# Patient Record
Sex: Female | Born: 1945 | Race: White | Hispanic: No | Smoking: Never smoker
Health system: Southern US, Community
[De-identification: ages and names within clinical notes are randomized; demographics above are authoritative.]

## PROBLEM LIST (undated history)

## (undated) DIAGNOSIS — E079 Disorder of thyroid, unspecified: Secondary | ICD-10-CM

## (undated) DIAGNOSIS — E785 Hyperlipidemia, unspecified: Secondary | ICD-10-CM

## (undated) DIAGNOSIS — I10 Essential (primary) hypertension: Secondary | ICD-10-CM

## (undated) HISTORY — DX: Hyperlipidemia, unspecified: E78.5

## (undated) HISTORY — PX: EYE SURGERY: SHX253

## (undated) HISTORY — DX: Essential (primary) hypertension: I10

## (undated) HISTORY — PX: OTHER SURGICAL HISTORY: SHX169

## (undated) HISTORY — DX: Disorder of thyroid, unspecified: E07.9

## (undated) HISTORY — PX: COSMETIC SURGERY: SHX468

## (undated) HISTORY — PX: BREAST SURGERY: SHX581

## (undated) HISTORY — PX: RECTOPERITONEAL FISTULA CLOSURE: SHX2314

## (undated) HISTORY — PX: TONSILLECTOMY: SUR1361

---

## 2003-10-20 ENCOUNTER — Ambulatory Visit (HOSPITAL_COMMUNITY): Admission: RE | Admit: 2003-10-20 | Discharge: 2003-10-20 | Payer: Self-pay | Admitting: *Deleted

## 2011-12-18 ENCOUNTER — Ambulatory Visit (INDEPENDENT_AMBULATORY_CARE_PROVIDER_SITE_OTHER): Payer: 59 | Admitting: Internal Medicine

## 2011-12-18 DIAGNOSIS — G5710 Meralgia paresthetica, unspecified lower limb: Secondary | ICD-10-CM

## 2011-12-18 DIAGNOSIS — M79609 Pain in unspecified limb: Secondary | ICD-10-CM

## 2011-12-18 DIAGNOSIS — B079 Viral wart, unspecified: Secondary | ICD-10-CM

## 2015-01-23 DIAGNOSIS — H2513 Age-related nuclear cataract, bilateral: Secondary | ICD-10-CM | POA: Diagnosis not present

## 2015-03-21 DIAGNOSIS — H18412 Arcus senilis, left eye: Secondary | ICD-10-CM | POA: Diagnosis not present

## 2015-03-21 DIAGNOSIS — H02839 Dermatochalasis of unspecified eye, unspecified eyelid: Secondary | ICD-10-CM | POA: Diagnosis not present

## 2015-03-21 DIAGNOSIS — H2511 Age-related nuclear cataract, right eye: Secondary | ICD-10-CM | POA: Diagnosis not present

## 2015-03-21 DIAGNOSIS — H18411 Arcus senilis, right eye: Secondary | ICD-10-CM | POA: Diagnosis not present

## 2015-04-03 DIAGNOSIS — H25811 Combined forms of age-related cataract, right eye: Secondary | ICD-10-CM | POA: Diagnosis not present

## 2015-04-03 DIAGNOSIS — H2511 Age-related nuclear cataract, right eye: Secondary | ICD-10-CM | POA: Diagnosis not present

## 2015-04-04 DIAGNOSIS — H2512 Age-related nuclear cataract, left eye: Secondary | ICD-10-CM | POA: Diagnosis not present

## 2015-04-21 DIAGNOSIS — H25812 Combined forms of age-related cataract, left eye: Secondary | ICD-10-CM | POA: Diagnosis not present

## 2015-04-21 DIAGNOSIS — H2512 Age-related nuclear cataract, left eye: Secondary | ICD-10-CM | POA: Diagnosis not present

## 2015-10-24 ENCOUNTER — Ambulatory Visit (INDEPENDENT_AMBULATORY_CARE_PROVIDER_SITE_OTHER): Payer: Medicare Other

## 2015-10-24 ENCOUNTER — Ambulatory Visit (INDEPENDENT_AMBULATORY_CARE_PROVIDER_SITE_OTHER): Payer: Medicare Other | Admitting: Family Medicine

## 2015-10-24 VITALS — BP 130/85 | HR 90 | Temp 97.7°F | Resp 16 | Ht 65.0 in | Wt 140.6 lb

## 2015-10-24 DIAGNOSIS — Z8639 Personal history of other endocrine, nutritional and metabolic disease: Secondary | ICD-10-CM

## 2015-10-24 DIAGNOSIS — M25511 Pain in right shoulder: Secondary | ICD-10-CM

## 2015-10-24 DIAGNOSIS — Z23 Encounter for immunization: Secondary | ICD-10-CM

## 2015-10-24 DIAGNOSIS — M75101 Unspecified rotator cuff tear or rupture of right shoulder, not specified as traumatic: Secondary | ICD-10-CM

## 2015-10-24 DIAGNOSIS — R252 Cramp and spasm: Secondary | ICD-10-CM

## 2015-10-24 DIAGNOSIS — M7541 Impingement syndrome of right shoulder: Secondary | ICD-10-CM

## 2015-10-24 DIAGNOSIS — M25519 Pain in unspecified shoulder: Secondary | ICD-10-CM | POA: Diagnosis not present

## 2015-10-24 LAB — TSH: TSH: 3.779 u[IU]/mL (ref 0.350–4.500)

## 2015-10-24 LAB — BASIC METABOLIC PANEL WITH GFR
Chloride: 103 mmol/L (ref 98–110)
Potassium: 4.4 mmol/L (ref 3.5–5.3)
Sodium: 137 mmol/L (ref 135–146)

## 2015-10-24 LAB — BASIC METABOLIC PANEL
BUN: 16 mg/dL (ref 7–25)
CO2: 25 mmol/L (ref 20–31)
Calcium: 9.5 mg/dL (ref 8.6–10.4)
Creat: 0.73 mg/dL (ref 0.50–0.99)
Glucose, Bld: 90 mg/dL (ref 65–99)

## 2015-10-24 MED ORDER — NAPROXEN 500 MG PO TABS: 500.0000 mg | ORAL_TABLET | Freq: Two times a day (BID) | ORAL | Status: DC

## 2015-10-24 MED ORDER — CYCLOBENZAPRINE HCL 5 MG PO TABS: 5.0000 mg | ORAL_TABLET | Freq: Every evening | ORAL | Status: DC | PRN

## 2015-10-24 NOTE — Patient Instructions (Signed)

## 2015-10-24 NOTE — Progress Notes (Signed)
 Chief Complaint:  Chief Complaint  Patient presents with  . Arm Pain    right x 4 weeks     HPI: Erin Gay is a 69 y.o. female who reports to [facility] today complaining of:   1. right arm pain on the lateral side of her right deltoid area, upper arm/lowere shoulder, described as mild to moderate aching pain, for the last 4 weeks. NKI. She has no numbness or tingling, She has had some radiation to the neck and and shoulder. No prior injuries. She thinks she had osteopenia 5 years ago. She is right handed and bowls without problems.  She has some pain with certain movements such as when she brings her arm out and over, back. She is not sure if she has night pain.   2. Muscle cramps any time, she has ahd msk cramps for several years, her toes and fingers would  Draw under. This can hurt a lot. She has it mostly with lifting or moving something a certain way, She never has a problems getting up. She has had a hx of trigger ginger and has had injections in her hands before. This has been a chronic isses  3. She has a history of thyroid nodule removal with thyroid fully retained. No one has followed her thyroid function since she had this benign nodule removed  4. Considering flu vaccine, husband is immunocompromised.   Past Medical History  Diagnosis Date  . Thyroid disease    Past Surgical History  Procedure Laterality Date  . Breast surgery    . Cosmetic surgery    . Eye surgery     Social History   Social History  . Marital Status: Single    Spouse Name: N/A  . Number of Children: N/A  . Years of Education: N/A   Social History Main Topics  . Smoking status: Never Smoker   . Smokeless tobacco: None  . Alcohol Use: None  . Drug Use: None  . Sexual Activity: Not Asked   Other Topics Concern  . None   Social History Narrative  . None   Family History  Problem Relation Age of Onset  . Cancer Mother   . Heart disease Mother   . Hyperlipidemia Mother   .  Heart disease Father   . Heart disease Brother   . Diabetes Brother    No Known Allergies Prior to Admission medications   Not on File     ROS: The patient denies fevers, chills, night sweats, unintentional weight loss, chest pain, palpitations, wheezing, dyspnea on exertion, nausea, vomiting, abdominal pain, dysuria, hematuria, melena  All other systems have been reviewed and were otherwise negative with the exception of those mentioned in the HPI and as above.    PHYSICAL EXAM: Filed Vitals:   10/24/15 0816  BP: 130/85  Pulse: 90  Temp: 97.7 F (36.5 C)  Resp: 16   Body mass index is 23.4 kg/(m^2).   General: Alert, no acute distress HEENT:  Normocephalic, atraumatic, oropharynx patent. EOMI, PERRLA, no appreciable thyroid goiter Cardiovascular:  Regular rate and rhythm, no rubs murmurs or gallops.  No Carotid bruits, radial pulse intact. No pedal edema.  Respiratory: Clear to auscultation bilaterally.  No wheezes, rales, or rhonchi.  No cyanosis, no use of accessory musculature Abdominal: No organomegaly, abdomen is soft and non-tender, positive bowel sounds. No masses. Skin: No rashes. Neurologic: Facial musculature symmetric. Psychiatric: Patient acts appropriately throughout our interaction. Lymphatic: No cervical or  submandibular lymphadenopathy Musculoskeletal: Gait intact. No edema, tenderness Neck exam normal-neg spurling Shoulder No deformity, no hypertrophy/atrophy, no erythema, no fluid, no wounds Full ROM Nontender at [facility] jt Neg Empty Can test, neg Lift off test, neg Speeds, Neg Hawkins/Neers 5/5 strength, 2/2 triceps and biceps DTRs  LABS: No results found for this or any previous visit.   EKG/XRAY:   Primary read interpreted by Dr. Marin Comment at [facility]. + DJD , no acute fracture or dislocation   ASSESSMENT/PLAN: Encounter Diagnoses  Name Primary?  . Right shoulder pain   . History of thyroid nodule   . Muscle cramping   . Rotator cuff impingement  syndrome of right shoulder Yes  . Flu vaccine need    Rotator cuff exercise given Flu vaccine given Rx Flexeril, Naproxen prn Biofreeze oitnment prn  Fu prn , if no improvement in 3 weeks,may consider steroid ( she wants to see Dr Rip Harbour)   Gross sideeffects, risk and benefits, and alternatives of medications d/w patient. Patient is aware that all medications have potential sideeffects and we are unable to predict every sideeffect or drug-drug interaction that may occur.    DO  10/24/2015 9:30 AM

## 2016-01-28 ENCOUNTER — Ambulatory Visit (INDEPENDENT_AMBULATORY_CARE_PROVIDER_SITE_OTHER): Payer: Medicare Other | Admitting: Physician Assistant

## 2016-01-28 VITALS — BP 158/98 | HR 69 | Temp 98.4°F | Resp 20 | Ht 65.0 in | Wt 142.6 lb

## 2016-01-28 DIAGNOSIS — M545 Low back pain, unspecified: Secondary | ICD-10-CM

## 2016-01-28 LAB — POCT URINALYSIS DIP (MANUAL ENTRY)
BILIRUBIN UA: NEGATIVE
BILIRUBIN UA: NEGATIVE
Glucose, UA: NEGATIVE
LEUKOCYTES UA: NEGATIVE
Nitrite, UA: NEGATIVE
Protein Ur, POC: NEGATIVE
RBC UA: NEGATIVE
Spec Grav, UA: 1.015
Urobilinogen, UA: 0.2
pH, UA: 5.5

## 2016-01-28 NOTE — Progress Notes (Signed)
01/28/2016 10:44 AM   DOB: 13-Sep-1946 / MRN: [id number]  SUBJECTIVE:  Erin Gay is a 70 y.o. never smnoking female presenting for the evaluation of gradually worsening "achy" left sided back pain that started 7 days ago. Associated symptoms include no other symptoms, and she denies weakness, numbness, tingling, dysuria, leg pain.Treatments tried thus far include Tylenol with some relief.. She denies fever, nausea, dysuria, frequency and urgency. No previous imaging of her back exist in [facility].   She has No Known Allergies.   She  has a past medical history of Thyroid disease.    She  reports that she has never smoked. She does not have any smokeless tobacco history on file. She  has no sexual activity history on file. The patient  has past surgical history that includes Breast surgery; Cosmetic surgery; and Eye surgery.  Her family history includes Cancer in her mother; Diabetes in her brother; Heart disease in her brother, father, and mother; Hyperlipidemia in her mother.  Review of Systems  Constitutional: Negative for fever and chills.  Eyes: Negative for blurred vision.  Respiratory: Negative for cough and shortness of breath.   Cardiovascular: Negative for chest pain.  Gastrointestinal: Negative for nausea and abdominal pain.  Genitourinary: Negative for dysuria, urgency and frequency.  Musculoskeletal: Positive for back pain. Negative for myalgias.  Skin: Negative for rash.  Neurological: Negative for dizziness, tingling and headaches.  Psychiatric/Behavioral: Negative for depression. The patient is not nervous/anxious.     Problem list and medications reviewed and updated by myself where necessary, and exist elsewhere in the encounter.   OBJECTIVE:  BP 158/98 mmHg  Pulse 69  Temp(Src) 98.4 F (36.9 C) (Oral)  Resp 20  Ht 5\' 5"  (1.651 m)  Wt 142 lb 9.6 oz (64.683 kg)  BMI 23.73 kg/m2  SpO2 98% CrCl cannot be calculated (Patient has no serum creatinine result on  file.).  Physical Exam  Constitutional: She is oriented to person, place, and time. She appears well-nourished. No distress.  Eyes: EOM are normal. Pupils are equal, round, and reactive to light.  Cardiovascular: Normal rate.   Pulmonary/Chest: Effort normal.  Abdominal: She exhibits no distension.  Neurological: She is alert and oriented to person, place, and time. No cranial nerve deficit. Gait normal.  Reflex Scores:      Patellar reflexes are 2+ on the right side and 2+ on the left side.      Achilles reflexes are 2+ on the right side and 2+ on the left side. Lower extremity strength 5/5.  Sensation intact and equal to light touch bilaterally.    Skin: Skin is dry. She is not diaphoretic.     Psychiatric: She has a normal mood and affect.  Vitals reviewed.   No results found for this or any previous visit (from the past 48 hour(s)).  No results found.  ASSESSMENT AND PLAN  Erin Gay was seen today for back pain.  Diagnoses and all orders for this visit:  Left-sided low back pain without sciatica: Her symptoms are consistent with an MSK etiology. Will screen for UTI however doubt this as she has no symptoms.  She has prescription strength Naprosyn and Flexeril at home from a previous appointment and I have advised that she take these for two weeks without fail.  RTC as needed for this problem.  -     POCT urinalysis dipstick   The patient was advised to call or return to clinic if she does not see an improvement  in symptoms or to seek the care of the closest emergency department if she worsens with the above plan.   Philis Fendt, MHS, PA-C [facility] 01/28/2016 10:44 AM

## 2016-04-09 DIAGNOSIS — L814 Other melanin hyperpigmentation: Secondary | ICD-10-CM | POA: Diagnosis not present

## 2016-04-09 DIAGNOSIS — B078 Other viral warts: Secondary | ICD-10-CM | POA: Diagnosis not present

## 2016-04-09 DIAGNOSIS — L821 Other seborrheic keratosis: Secondary | ICD-10-CM | POA: Diagnosis not present

## 2016-04-09 DIAGNOSIS — L239 Allergic contact dermatitis, unspecified cause: Secondary | ICD-10-CM | POA: Diagnosis not present

## 2016-04-09 DIAGNOSIS — D1801 Hemangioma of skin and subcutaneous tissue: Secondary | ICD-10-CM | POA: Diagnosis not present

## 2016-08-13 DIAGNOSIS — H04123 Dry eye syndrome of bilateral lacrimal glands: Secondary | ICD-10-CM | POA: Diagnosis not present

## 2017-02-16 NOTE — Progress Notes (Signed)
Subjective:    Patient ID: Erin Gay, female    DOB: November 23, 1946, 71 y.o.   MRN: [id number] Chief Complaint  Patient presents with  . Tailbone Pain    x2 weeks patient noticed the pain.    HPI   Erin Gay is a 71 yo woman here to evaluate on left hip/gluteal. Erin is my first time meeting Erin Gay.    About a month ago she sat down very hard on a chair and had immed pain in Erin left sit bone, heard a crack. Then was fine until she had insidious onset of pain in same place 2 wks prior when boweling.  Hurts to sit, hurts to bowel. Hurts to lean forward. Can't identify other exacerbating movements. No change in gait or activity needed other than boweling. She tried ibuprofen a few times - not sure if it helped. Has not tried anything else, no similar sxs prior. Normal appetite. No abd pain, nml GI, nml GU.   Over the past 8-10 years has had periodic occ sensation in Erin right lateral thigh, itches at night. Feels like there is something under Erin skin, in the muscle. Told Erin prior PCP about it initially - reports xray was normal, diagnosis unknown but it eventually went away. Now recurring, really itches at night. Never felt any actual mass, just the sensation feels different when it is touched - "like something is there." No rash or skin change, normal leg/thight function.  Elev BP: Has always attributed to pain at prior visit. Does not check outside office. Long overdue for routine medical care - pt hasn't come in as she doesn't want to take a daily pill.  Erin husband (and son) have severe early onset CAD/CHF (Erin Gay is my pt as well.) so she cooks at heart healthy diet, occ sprinkles some salt on Erin food but does not cook w/ it and buys low sodium products.  Past Medical History:  Diagnosis Date  . Thyroid disease    Past Surgical History:  Procedure Laterality Date  . BREAST SURGERY    . COSMETIC SURGERY    . EYE SURGERY     No current outpatient  prescriptions on file prior to visit.   No current facility-administered medications on file prior to visit.    No Known Allergies Family History  Problem Relation Age of Onset  . Cancer Mother   . Heart disease Mother   . Hyperlipidemia Mother   . Heart disease Father   . Heart disease Brother   . Diabetes Brother    Social History   Social History  . Marital status: Single    Spouse name: N/A  . Number of children: N/A  . Years of education: N/A   Social History Main Topics  . Smoking status: Never Smoker  . Smokeless tobacco: Never Used  . Alcohol use None  . Drug use: Unknown  . Sexual activity: Not Asked   Other Topics Concern  . None   Social History Narrative  . None   Depression screen [facility] 2/9 02/17/2017 01/28/2016 10/24/2015  Decreased Interest 0 0 0  Down, Depressed, Hopeless 0 0 0  PHQ - 2 Score 0 0 0    Review of Systems  Constitutional: Positive for activity change. Negative for chills, fever and unexpected weight change.  Eyes: Negative for visual disturbance.  Respiratory: Negative for cough, chest tightness, shortness of breath and wheezing.   Cardiovascular: Negative for chest pain, palpitations  and leg swelling.  Gastrointestinal: Negative for abdominal pain, anal bleeding, blood in stool, constipation, diarrhea and rectal pain.  Genitourinary: Negative for decreased urine volume, difficulty urinating, dysuria and urgency.  Musculoskeletal: Positive for arthralgias and myalgias. Negative for back pain, gait problem and joint swelling.  Skin: Negative for color change, rash and wound.  Neurological: Negative for dizziness, weakness, light-headedness, numbness and headaches.  Hematological: Does not bruise/bleed easily.  Psychiatric/Behavioral: Negative for dysphoric mood.       Objective:   Physical Exam  Constitutional: She is oriented to person, place, and time. She appears well-developed and well-nourished. No distress.  HENT:  Head:  Normocephalic and atraumatic.  Right Ear: External ear normal.  Left Ear: External ear normal.  Eyes: Conjunctivae are normal. No scleral icterus.  Neck: Normal range of motion. Neck supple. No thyromegaly present.  Questions of right thyroid nodule  Cardiovascular: Normal rate, regular rhythm, normal heart sounds and intact distal pulses.   Pulmonary/Chest: Effort normal and breath sounds normal. No respiratory distress.  Musculoskeletal: She exhibits no edema.       Right hip: Normal.       Left hip: Normal.       Lumbar back: She exhibits no tenderness, no bony tenderness and no pain.  Pt w/ "tight hamstrings" with moderately limited passive ROM throughout both hips but none induce the pain. Neg stright leg raisde bilaterally. Normal hamstring and quad strength on exam. Some point tenderness just medial to the left ischial tuberosity on palpation.  Lymphadenopathy:    She has no cervical adenopathy.  Neurological: She is alert and oriented to person, place, and time. She has normal strength. She displays no atrophy. No sensory deficit. She exhibits normal muscle tone. Gait normal.  Reflex Scores:      Patellar reflexes are 2+ on the right side and 2+ on the left side. Skin: Skin is warm and dry. She is not diaphoretic. No erythema.  Psychiatric: She has a normal mood and affect. Erin behavior is normal.    BP (!) 172/98   Pulse 75   Resp 18   Ht 5\' 5"  (1.651 m)   Wt 138 lb (62.6 kg)   SpO2 98%   BMI 22.96 kg/m     Dg Hip Unilat W Or W/o Pelvis 2-3 Views Left  Result Date: 02/17/2017 CLINICAL DATA:  Left ischial tuberosity pain, no known injury EXAM: DG HIP (WITH OR WITHOUT PELVIS) 2-3V LEFT COMPARISON:  None. FINDINGS: Three views of the left hip submitted. No acute fracture or subluxation. Bilateral hip joints are symmetrical in appearance. 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 IMPRESSION: No acute fracture or subluxation. Degenerative changes pubic symphysis.  Electronically Signed   By: Lahoma Crocker M.D.   On: 02/17/2017 09:10    Assessment & Plan:   1. Ischial pain, left - suspect due to strain at hamstring insertion, start daily nsaid and ice. Avoid severely aggravating activities. Recheck in sev wks, cons ortho vs PT if sxs persisting.  2. Essential hypertension - persistent and has been elev at Erin few prior visits over the past years as well. Already eats a heart healthy low sodium diet due to Erin husbands CAD (he is a pt of mine- [employer], Erin Gay is also my pt who had a NSTEM at 71 yo) so would not expect to see any substantial improvement with diet change so rec starting med. Pt VERY reluctant but ultimately agrees to try hctz - recheck in 3-4 wks.  3. Medication monitoring encounter   4. Meralgia paraesthetica, right - for many years, waxing/waning. No known etiology but no concerning or alarm sxs, not botherseome enough to warrant treatment though might benefit from low dose TCA qhs if pruritis is keeping Erin awake at night. Offered neurology referral for further eval if pt would like but she is ok with watchful waiting for now - refer if worsens at all. Avoid external compression to right lateral hip area.   5. Hyperlipidemia, unspecified hyperlipidemia type - new diagnosis on today's labs, ASCVD risk 12.4% so rec start pravastatin. Even if BP were well-controlled on meds, statin would still be indicated with ASCVD risk of 10.8%.   Repeat cmp at next OV in 3-4 wks due to new meds Recheck thyroid exam at f/u. H/o thyroid nodule surgically removed so rec tsh w/ next labs and cons thyroid US.  Orders Placed Erin Encounter  Procedures  . DG HIP UNILAT W OR W/O PELVIS 2-3 VIEWS LEFT    Standing Status:   Future    Number of Occurrences:   1    Standing Expiration Date:   02/17/2018    Order Specific Question:   Reason for Exam (SYMPTOM  OR DIAGNOSIS REQUIRED)    Answer:   pain over left ischial tuberosity, no known injury    Order Specific  Question:   Preferred imaging location?    Answer:   External  . Lipid panel    Order Specific Question:   Has the patient fasted?    Answer:   Yes  . Comprehensive metabolic panel    Order Specific Question:   Has the patient fasted?    Answer:   Yes    Meds ordered Erin encounter  Medications  . hydrochlorothiazide (HYDRODIURIL) 25 MG tablet    Sig: Take 1 tablet (25 mg total) by mouth daily.    Dispense:  30 tablet    Refill:  1  . meloxicam (MOBIC) 7.5 MG tablet    Sig: Take 1 tablet (7.5 mg total) by mouth 2 (two) times daily.    Dispense:  60 tablet    Refill:  0  . pravastatin (PRAVACHOL) 40 MG tablet    Sig: Take 1 tablet (40 mg total) by mouth daily.    Dispense:  30 tablet    Refill:  2     [name], M.D.  Primary Care at [facility] [address] [place], [place] [address] [phone] phone [phone] fax  02/18/17 11:57 AM

## 2017-02-17 ENCOUNTER — Ambulatory Visit (INDEPENDENT_AMBULATORY_CARE_PROVIDER_SITE_OTHER): Payer: Medicare Other

## 2017-02-17 ENCOUNTER — Ambulatory Visit (INDEPENDENT_AMBULATORY_CARE_PROVIDER_SITE_OTHER): Payer: Medicare Other | Admitting: Family Medicine

## 2017-02-17 ENCOUNTER — Encounter: Payer: Self-pay | Admitting: Family Medicine

## 2017-02-17 VITALS — BP 172/98 | HR 75 | Resp 18 | Ht 65.0 in | Wt 138.0 lb

## 2017-02-17 DIAGNOSIS — G5711 Meralgia paresthetica, right lower limb: Secondary | ICD-10-CM

## 2017-02-17 DIAGNOSIS — M25552 Pain in left hip: Secondary | ICD-10-CM | POA: Diagnosis not present

## 2017-02-17 DIAGNOSIS — I10 Essential (primary) hypertension: Secondary | ICD-10-CM

## 2017-02-17 DIAGNOSIS — M1612 Unilateral primary osteoarthritis, left hip: Secondary | ICD-10-CM | POA: Diagnosis not present

## 2017-02-17 DIAGNOSIS — E785 Hyperlipidemia, unspecified: Secondary | ICD-10-CM | POA: Diagnosis not present

## 2017-02-17 DIAGNOSIS — Z5181 Encounter for therapeutic drug level monitoring: Secondary | ICD-10-CM

## 2017-02-17 MED ORDER — HYDROCHLOROTHIAZIDE 25 MG PO TABS: 25.0000 mg | ORAL_TABLET | Freq: Every day | ORAL | 1 refills | Status: DC

## 2017-02-17 MED ORDER — MELOXICAM 7.5 MG PO TABS: 7.5000 mg | ORAL_TABLET | Freq: Two times a day (BID) | ORAL | 0 refills | Status: DC

## 2017-02-17 NOTE — Patient Instructions (Addendum)
You have having pain over your left "sit bone" which is the ischial tuberosity.  This is where the hamstring attaches and so is most often from a high hamstring strain.  As long as the xray looks ok, I want you to stay on the anti-inflammatory meloxicam at least once a day - you can take a second dose if you are having more pain. Do not use with any other otc pain medication other than tylenol/acetaminophen - so no aleve, ibuprofen, motrin, advil, etc. Ice the area for 5-10 minutes 3 times a day and lets recheck in 3-4 weeks. At that point if you are still having pain we will see how you have responded to decide whether we need to proceed with physical therapy or orthopedic evaluation.  Start on the blood pressure pill every morning - give it a try. If you hate it you can come off of it but taking 1 pill a day now is better than taking cons of pills several times a day later. Make sure you're drinking plenty water to get an extra source of potassium in your diet.  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  There are medicines for nerve pain but most of these are sedating and so as long as you can tolerate it it is often better just put up with it. It is best to make sure you aren't weightbearing anything tight or having regular compression against the side of your right upper hip as this will make it worse. If you would like further evaluation I am happy to get you to a neurologist     IF you received an x-ray today, you will receive an invoice from [facility]. Please contact [facility] at [phone] with questions or concerns regarding your invoice.   IF you received labwork today, you will receive an invoice from [place]. Please contact [facility] at [phone] with questions or concerns regarding your invoice.   Our billing staff will not be able to assist you with questions regarding bills from these companies.  You  will be contacted with the lab results as soon as they are available. The fastest way to get your results is to activate your My Chart account. Instructions are located on the last page of this paperwork. If you have not heard from [place] regarding the results in 2 weeks, please contact this office.     Hypertension Hypertension, commonly called high blood pressure, is when the force of blood pumping through the arteries is too strong. The arteries are the blood vessels that carry blood from the heart throughout the body. Hypertension forces the heart to work harder to pump blood and may cause arteries to become narrow or stiff. Having untreated or uncontrolled hypertension can cause heart attacks, strokes, kidney disease, and other problems. A blood pressure reading consists of a higher number over a lower number. Ideally, your blood pressure should be below 120/80. The first ("top") number is called the systolic pressure. It is a measure of the pressure in your arteries as your heart beats. The second ("bottom") number is called the diastolic pressure. It is a measure of the pressure in your arteries as the heart relaxes. What are the causes? The cause of this condition is not known. What increases the risk? Some risk factors for high blood pressure are under your control. Others are not. Factors you can change   Smoking.  Having type 2 diabetes mellitus, high cholesterol, or  both.  Not getting enough exercise or physical activity.  Being overweight.  Having too much fat, sugar, calories, or salt (sodium) in your diet.  Drinking too much alcohol. Factors that are difficult or impossible to change   Having chronic kidney disease.  Having a family history of high blood pressure.  Age. Risk increases with age.  Race. You may be at higher risk if you are African-American.  Gender. Men are at higher risk than women before age 6. After age 63, women are at higher risk than men.  Having  obstructive sleep apnea.  Stress. What are the signs or symptoms? Extremely high blood pressure (hypertensive crisis) may cause:  Headache.  Anxiety.  Shortness of breath.  Nosebleed.  Nausea and vomiting.  Severe chest pain.  Jerky movements you cannot control (seizures). How is this diagnosed? This condition is diagnosed by measuring your blood pressure while you are seated, with your arm resting on a surface. The cuff of the blood pressure monitor will be placed directly against the skin of your upper arm at the level of your heart. It should be measured at least twice using the same arm. Certain conditions can cause a difference in blood pressure between your right and left arms. Certain factors can cause blood pressure readings to be lower or higher than normal (elevated) for a short period of time:  When your blood pressure is higher when you are in a health care provider's office than when you are at home, this is called white coat hypertension. Most people with this condition do not need medicines.  When your blood pressure is higher at home than when you are in a health care provider's office, this is called masked hypertension. Most people with this condition may need medicines to control blood pressure. If you have a high blood pressure reading during one visit or you have normal blood pressure with other risk factors:  You may be asked to return on a different day to have your blood pressure checked again.  You may be asked to monitor your blood pressure at home for 1 week or longer. If you are diagnosed with hypertension, you may have other blood or imaging tests to help your health care provider understand your overall risk for other conditions. How is this treated? This condition is treated by making healthy lifestyle changes, such as eating healthy foods, exercising more, and reducing your alcohol intake. Your health care provider may prescribe medicine if lifestyle  changes are not enough to get your blood pressure under control, and if:  Your systolic blood pressure is above 130.  Your diastolic blood pressure is above 80. Your personal target blood pressure may vary depending on your medical conditions, your age, and other factors. Follow these instructions at home: Eating and drinking   Eat a diet that is high in fiber and potassium, and low in sodium, added sugar, and fat. An example eating plan is called the DASH (Dietary Approaches to Stop Hypertension) diet. To eat this way:  Eat plenty of fresh fruits and vegetables. Try to fill half of your plate at each meal with fruits and vegetables.  Eat whole grains, such as whole wheat pasta, brown rice, or whole grain bread. Fill about one quarter of your plate with whole grains.  Eat or drink low-fat dairy products, such as skim milk or low-fat yogurt.  Avoid fatty cuts of meat, processed or cured meats, and poultry with skin. Fill about one quarter of your plate  with lean proteins, such as fish, chicken without skin, beans, eggs, and tofu.  Avoid premade and processed foods. These tend to be higher in sodium, added sugar, and fat.  Reduce your daily sodium intake. Most people with hypertension should eat less than 1,500 mg of sodium a day.  Limit alcohol intake to no more than 1 drink a day for nonpregnant women and 2 drinks a day for men. One drink equals 12 oz of beer, 5 oz of wine, or 1 oz of hard liquor. Lifestyle   Work with your health care provider to maintain a healthy body weight or to lose weight. Ask what an ideal weight is for you.  Get at least 30 minutes of exercise that causes your heart to beat faster (aerobic exercise) most days of the week. Activities may include walking, swimming, or biking.  Include exercise to strengthen your muscles (resistance exercise), such as pilates or lifting weights, as part of your weekly exercise routine. Try to do these types of exercises for 30  minutes at least 3 days a week.  Do not use any products that contain nicotine or tobacco, such as cigarettes and e-cigarettes. If you need help quitting, ask your health care provider.  Monitor your blood pressure at home as told by your health care provider.  Keep all follow-up visits as told by your health care provider. This is important. Medicines   Take over-the-counter and prescription medicines only as told by your health care provider. Follow directions carefully. Blood pressure medicines must be taken as prescribed.  Do not skip doses of blood pressure medicine. Doing this puts you at risk for problems and can make the medicine less effective.  Ask your health care provider about side effects or reactions to medicines that you should watch for. Contact a health care provider if:  You think you are having a reaction to a medicine you are taking.  You have headaches that keep coming back (recurring).  You feel dizzy.  You have swelling in your ankles.  You have trouble with your vision. Get help right away if:  You develop a severe headache or confusion.  You have unusual weakness or numbness.  You feel faint.  You have severe pain in your chest or abdomen.  You vomit repeatedly.  You have trouble breathing. Summary  Hypertension is when the force of blood pumping through your arteries is too strong. If this condition is not controlled, it may put you at risk for serious complications.  Your personal target blood pressure may vary depending on your medical conditions, your age, and other factors. For most people, a normal blood pressure is less than 120/80.  Hypertension is treated with lifestyle changes, medicines, or a combination of both. Lifestyle changes include weight loss, eating a healthy, low-sodium diet, exercising more, and limiting alcohol. This information is not intended to replace advice given to you by your health care provider. Make sure you  discuss any questions you have with your health care provider. Document Released: 12/02/2005 Document Revised: 10/30/2016 Document Reviewed: 10/30/2016 Elsevier Interactive Patient Education  2017 [facility] Ask your health care provider which exercises are safe for you. Do exercises exactly as told by your health care provider and adjust them as directed. It is normal to feel mild stretching, pulling, tightness, or discomfort as you do these exercises, but you should stop right away if you feel sudden pain or your pain gets worse.Do not begin these exercises  until told by your health care provider. Stretching and range of motion exercises These exercises warm up your muscles and joints and improve the movement and flexibility of your thigh. These exercises also help to relieve pain, numbness, and tingling. Exercise A: Hamstring stretch, supine   1. Lie on your back. Loop a belt or towel across the ball of your left / right foot The ball of your foot is on the walking surface, right under your toes. 2. Straighten your left / right knee and slowly pull on the belt to raise your leg. Stop when you feel a gentle stretch behind your left / right knee or thigh.  Do not allow the knee to bend.  Keep your other leg flat on the floor. 3. Hold this position for __________ seconds. Repeat __________ times. Complete this exercise __________ times a day. Strengthening exercises These exercises build strength and endurance in your thigh. Endurance is the ability to use your muscles for a long time, even after they get tired. Exercise B: Straight leg raises (  hip extensors) 1. Lie on your belly on a bed or a firm surface with a pillow under your hips. 2. Bend your left / right knee so your foot is straight up in the air. 3. Squeeze your buttock muscles and lift your left / right thigh off the bed. Do not let your back arch. 4. Hold this position for  __________seconds. 5. Slowly return to the starting position. Let your muscles relax completely before you do another repetition. Repeat __________ times. Complete this exercise __________ times a day. Exercise C: Bridge ( hip extensors) 1. Lie on your back on a firm surface with your knees bent and your feet flat on the floor. 2. Tighten your buttocks muscles and lift your bottom off the floor until your trunk is level with your thighs.  You should feel the muscles working in your buttocks and the back of your thighs. If you do not feel these muscles, slide your feet 1-2 inches (2.5-5 cm) farther away from your buttocks.  Do not arch your back. 3. Hold this position for __________ seconds. 4. Slowly lower your hips to the starting position. 5. Let your buttocks muscles relax completely between repetitions. If this exercise is too easy, try doing it with your arms crossed over your chest. Repeat __________ times. Complete this exercise __________ times a day. Exercise D: Hamstring eccentric, prone 1. Lie on your belly on a bed or on the floor. 2. Start with your legs straight. Cross your legs at the ankles with your left / right leg on top. 3. Using your bottom leg to do the work, bend both knees. 4. Using just your left / right leg alone, slowly lower your leg back down toward the bed. Add a __________ weight as told by your health care provider. 5. Let your muscles relax completely between repetitions. Repeat __________ times. Complete this exercise __________ times a day. Exercise E: Squats 1. Stand in front of a table, with your feet and knees pointing straight ahead. You may rest your hands on the table for balance but not for support. 2. Slowly bend your knees and lower your hips like you are going to sit in a chair. Keep your thighs straight or pointed slightly outward.  Keep your weight over your heels, not over your toes.  Keep your lower legs upright so they are parallel with  the table legs.  Do not let your hips go lower than your  knees. Stop when your knees are bent to the shape of an upside-down letter "L" (90 degree angle).  Do not bend lower than told by your health care provider.  If your knee pain increases, do not bend as low. 3. Hold the squat position __________ seconds. 4. Slowly push with your legs to return to standing. Do not use your hands to pull yourself to standing. Repeat __________ times. Complete this exercise __________ times a day. This information is not intended to replace advice given to you by your health care provider. Make sure you discuss any questions you have with your health care provider. Document Released: 12/02/2005 Document Revised: 08/08/2016 Document Reviewed: 09/05/2015 Elsevier Interactive Patient Education  2017 [place]. Potassium Content of Foods Potassium is a mineral found in many foods and drinks. It helps keep fluids and minerals balanced in your body and affects how steadily your heart beats. Potassium also helps control your blood pressure and keep your muscles and nervous system healthy. Certain health conditions and medicines may change the balance of potassium in your body. When this happens, you can help balance your level of potassium through the foods that you do or do not eat. Your health care provider or dietitian may recommend an amount of potassium that you should have each day. The following lists of foods provide the amount of potassium (in parentheses) per serving in each item. High in potassium The following foods and beverages have 200 mg or more of potassium per serving:  Apricots, 2 raw or 5 dry (200 mg).  Artichoke, 1 medium (345 mg).  Avocado, raw,  each (245 mg).  Banana, 1 medium (425 mg).  Beans, lima, or baked beans, canned,  cup (280 mg).  Beans, white, canned,  cup (595 mg).  Beef roast, 3 oz (320 mg).  Beef, ground, 3 oz (270 mg).  Beets, raw or cooked,  cup (260  mg).  Bran muffin, 2 oz (300 mg).  Broccoli,  cup (230 mg).  Brussels sprouts,  cup (250 mg).  Cantaloupe,  cup (215 mg).  Cereal, 100% bran,  cup (200-400 mg).  Cheeseburger, single, fast food, 1 each (225-400 mg).  Chicken, 3 oz (220 mg).  Clams, canned, 3 oz (535 mg).  Crab, 3 oz (225 mg).  Dates, 5 each (270 mg).  Dried beans and peas,  cup (300-475 mg).  Figs, dried, 2 each (260 mg).  Fish: halibut, tuna, cod, snapper, 3 oz (480 mg).  Fish: salmon, haddock, swordfish, perch, 3 oz (300 mg).  Fish, tuna, canned 3 oz (200 mg).  [place] fries, fast food, 3 oz (470 mg).  Granola with fruit and nuts,  cup (200 mg).  Grapefruit juice,  cup (200 mg).  Greens, beet,  cup (655 mg).  Honeydew melon,  cup (200 mg).  Kale, raw, 1 cup (300 mg).  Kiwi, 1 medium (240 mg).  Kohlrabi, rutabaga, parsnips,  cup (280 mg).  Lentils,  cup (365 mg).  Mango, 1 each (325 mg).  Milk, chocolate, 1 cup (420 mg).  Milk: nonfat, low-fat, whole, buttermilk, 1 cup (350-380 mg).  Molasses, 1 Tbsp (295 mg).  Mushrooms,  cup (280) mg.  Nectarine, 1 each (275 mg).  Nuts: almonds, peanuts, hazelnuts, [place], cashew, mixed, 1 oz (200 mg).  Nuts, pistachios, 1 oz (295 mg).  Orange, 1 each (240 mg).  Orange juice,  cup (235 mg).  Papaya, medium,  fruit (390 mg).  Peanut butter, chunky, 2 Tbsp (240 mg).  Peanut butter, smooth, 2  Tbsp (210 mg).  Pear, 1 medium (200 mg).  Pomegranate, 1 whole (400 mg).  Pomegranate juice,  cup (215 mg).  Pork, 3 oz (350 mg).  Potato chips, salted, 1 oz (465 mg).  Potato, baked with skin, 1 medium (925 mg).  Potatoes, boiled,  cup (255 mg).  Potatoes, mashed,  cup (330 mg).  Prune juice,  cup (370 mg).  Prunes, 5 each (305 mg).  Pudding, chocolate,  cup (230 mg).  Pumpkin, canned,  cup (250 mg).  Raisins, seedless,  cup (270 mg).  Seeds, sunflower or pumpkin, 1 oz (240 mg).  Soy milk, 1 cup (300  mg).  Spinach,  cup (420 mg).  Spinach, canned,  cup (370 mg).  Sweet potato, baked with skin, 1 medium (450 mg).  Swiss chard,  cup (480 mg).  Tomato or vegetable juice,  cup (275 mg).  Tomato sauce or puree,  cup (400-550 mg).  Tomato, raw, 1 medium (290 mg).  Tomatoes, canned,  cup (200-300 mg).  [place], 3 oz (250 mg).  Wheat germ, 1 oz (250 mg).  Winter squash,  cup (250 mg).  Yogurt, plain or fruited, 6 oz (260-435 mg).  Zucchini,  cup (220 mg). Moderate in potassium The following foods and beverages have 50-200 mg of potassium per serving:  Apple, 1 each (150 mg).  Apple juice,  cup (150 mg).  Applesauce,  cup (90 mg).  Apricot nectar,  cup (140 mg).  Asparagus, small spears,  cup or 6 spears (155 mg).  Bagel, cinnamon raisin, 1 each (130 mg).  Bagel, egg or plain, 4 in., 1 each (70 mg).  Beans, green,  cup (90 mg).  Beans, yellow,  cup (190 mg).  Beer, regular, 12 oz (100 mg).  Beets, canned,  cup (125 mg).  Blackberries,  cup (115 mg).  Blueberries,  cup (60 mg).  Bread, whole wheat, 1 slice (70 mg).  Broccoli, raw,  cup (145 mg).  Cabbage,  cup (150 mg).  Carrots, cooked or raw,  cup (180 mg).  Cauliflower, raw,  cup (150 mg).  Celery, raw,  cup (155 mg).  Cereal, bran flakes, cup (120-150 mg).  Cheese, cottage,  cup (110 mg).  Cherries, 10 each (150 mg).  Chocolate, 1 oz bar (165 mg).  Coffee, brewed 6 oz (90 mg).  Corn,  cup or 1 ear (195 mg).  Cucumbers,  cup (80 mg).  Egg, large, 1 each (60 mg).  Eggplant,  cup (60 mg).  Endive, raw, cup (80 mg).  English muffin, 1 each (65 mg).  Fish, orange roughy, 3 oz (150 mg).  Frankfurter, beef or pork, 1 each (75 mg).  Fruit cocktail,  cup (115 mg).  Grape juice,  cup (170 mg).  Grapefruit,  fruit (175 mg).  Grapes,  cup (155 mg).  Greens: kale, turnip, collard,  cup (110-150 mg).  Ice cream or frozen yogurt, chocolate,  cup  (175 mg).  Ice cream or frozen yogurt, vanilla,  cup (120-150 mg).  Lemons, limes, 1 each (80 mg).  Lettuce, all types, 1 cup (100 mg).  Mixed vegetables,  cup (150 mg).  Mushrooms, raw,  cup (110 mg).  Nuts: walnuts, pecans, or macadamia, 1 oz (125 mg).  Oatmeal,  cup (80 mg).  Okra,  cup (110 mg).  Onions, raw,  cup (120 mg).  Peach, 1 each (185 mg).  Peaches, canned,  cup (120 mg).  Pears, canned,  cup (120 mg).  Peas, green, frozen,  cup (90 mg).  Peppers,  green,  cup (130 mg).  Peppers, red,  cup (160 mg).  Pineapple juice,  cup (165 mg).  Pineapple, fresh or canned,  cup (100 mg).  Plums, 1 each (105 mg).  Pudding, vanilla,  cup (150 mg).  Raspberries,  cup (90 mg).  Rhubarb,  cup (115 mg).  Rice, wild,  cup (80 mg).  Shrimp, 3 oz (155 mg).  Spinach, raw, 1 cup (170 mg).  Strawberries,  cup (125 mg).  Summer squash  cup (175-200 mg).  Swiss chard, raw, 1 cup (135 mg).  Tangerines, 1 each (140 mg).  Tea, brewed, 6 oz (65 mg).  Turnips,  cup (140 mg).  Watermelon,  cup (85 mg).  Wine, red, table, 5 oz (180 mg).  Wine, white, table, 5 oz (100 mg). Low in potassium The following foods and beverages have less than 50 mg of potassium per serving.  Bread, white, 1 slice (30 mg).  Carbonated beverages, 12 oz (less than 5 mg).  Cheese, 1 oz (20-30 mg).  Cranberries,  cup (45 mg).  Cranberry juice cocktail,  cup (20 mg).  Fats and oils, 1 Tbsp (less than 5 mg).  Hummus, 1 Tbsp (32 mg).  Nectar: papaya, mango, or pear,  cup (35 mg).  Rice, white or brown,  cup (50 mg).  Spaghetti or macaroni,  cup cooked (30 mg).  Tortilla, flour or corn, 1 each (50 mg).  Waffle, 4 in., 1 each (50 mg).  Water chestnuts,  cup (40 mg). This information is not intended to replace advice given to you by your health care provider. Make sure you discuss any questions you have with your health care provider. Document  Released: 07/16/2005 Document Revised: 05/09/2016 Document Reviewed: 10/29/2013 Elsevier Interactive Patient Education  2017 [employer].

## 2017-02-18 LAB — LIPID PANEL
CHOLESTEROL TOTAL: 295 mg/dL — AB (ref 100–199)
Chol/HDL Ratio: 4.2 ratio units (ref 0.0–4.4)
HDL: 71 mg/dL (ref >39)
LDL CALC: 204 mg/dL — AB (ref 0–99)
TRIGLYCERIDES: 98 mg/dL (ref 0–149)
VLDL CHOLESTEROL CAL: 20 mg/dL (ref 5–40)

## 2017-02-18 LAB — COMPREHENSIVE METABOLIC PANEL
A/G RATIO: 1.5 (ref 1.2–2.2)
ALBUMIN: 4.3 g/dL (ref 3.6–4.8)
ALK PHOS: 68 IU/L (ref 39–117)
ALT: 15 IU/L (ref 0–32)
AST: 19 IU/L (ref 0–40)
BUN / CREAT RATIO: 19 (ref 12–28)
BUN: 15 mg/dL (ref 8–27)
Bilirubin Total: 0.9 mg/dL (ref 0.0–1.2)
CHLORIDE: 101 mmol/L (ref 96–106)
CO2: 23 mmol/L (ref 18–29)
Calcium: 9.2 mg/dL (ref 8.7–10.3)
Creatinine, Ser: 0.8 mg/dL (ref 0.57–1.00)
GFR calc Af Amer: 87 mL/min/{1.73_m2} (ref >59)
GFR calc non Af Amer: 75 mL/min/{1.73_m2} (ref >59)
GLOBULIN, TOTAL: 2.8 g/dL (ref 1.5–4.5)
Glucose: 84 mg/dL (ref 65–99)
POTASSIUM: 4.1 mmol/L (ref 3.5–5.2)
SODIUM: 143 mmol/L (ref 134–144)
Total Protein: 7.1 g/dL (ref 6.0–8.5)

## 2017-02-18 MED ORDER — PRAVASTATIN SODIUM 40 MG PO TABS: 40.0000 mg | ORAL_TABLET | Freq: Every day | ORAL | 2 refills | Status: DC

## 2017-02-19 ENCOUNTER — Telehealth: Payer: Self-pay | Admitting: Emergency Medicine

## 2017-02-19 NOTE — Telephone Encounter (Signed)
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  This is reassuring that it likely is a strain at the hamstring insertion point on her "sit bones".

## 2017-03-17 ENCOUNTER — Encounter: Payer: Self-pay | Admitting: Family Medicine

## 2017-03-17 ENCOUNTER — Ambulatory Visit (INDEPENDENT_AMBULATORY_CARE_PROVIDER_SITE_OTHER): Payer: Medicare Other | Admitting: Family Medicine

## 2017-03-17 VITALS — BP 137/85 | HR 71 | Temp 98.1°F | Resp 16 | Ht 65.0 in | Wt 137.0 lb

## 2017-03-17 DIAGNOSIS — Z5181 Encounter for therapeutic drug level monitoring: Secondary | ICD-10-CM

## 2017-03-17 DIAGNOSIS — Z23 Encounter for immunization: Secondary | ICD-10-CM | POA: Diagnosis not present

## 2017-03-17 DIAGNOSIS — E785 Hyperlipidemia, unspecified: Secondary | ICD-10-CM | POA: Diagnosis not present

## 2017-03-17 DIAGNOSIS — M25552 Pain in left hip: Secondary | ICD-10-CM

## 2017-03-17 DIAGNOSIS — Z8639 Personal history of other endocrine, nutritional and metabolic disease: Secondary | ICD-10-CM | POA: Diagnosis not present

## 2017-03-17 DIAGNOSIS — I10 Essential (primary) hypertension: Secondary | ICD-10-CM

## 2017-03-17 NOTE — Progress Notes (Signed)
Subjective:    Patient ID: Erin Gay, female    DOB: [birth year], 71 y.o.   MRN: [id number] Chief Complaint  Patient presents with  . Follow-up    4 week f/u ischial pain bp     HPI  [name] is a delightful 71 year old woman who I first met 6 weeks ago when she presented for left ischial tuberosity pain.  Left ischial pain: I suspected this was due to strain at hamstring insertion and rec pt to start scheduled nsaid meloxicam 7.5 bid and ice while avoiding severely aggravating activities.She did not notice any benefit from the meloxicam so she stopped it. It is slowly getting some better though continues to aggravate it by bowling - her league will be over soon though and then will rest it.  HTN: We noted at that time that her BP was very elevated at 172/98 despite a low salt diet so started pt on hctz though she was very reluctant. Tolerated hctz well and wants to go off  HLD: new diagnosis on today's labs, ASCVD risk 12.4% so rec start pravastatin 40. Even if BP were well-controlled on meds, statin would still be indicated with ASCVD risk of 10.8%. No prob w/ the pravastatin.  H/o thyroid nodule: s/p surgical removal so rec tsh w/ next labs and cons thyroid US.  Past Medical History:  Diagnosis Date  . Thyroid disease    Past Surgical History:  Procedure Laterality Date  . BREAST SURGERY    . COSMETIC SURGERY    . EYE SURGERY     Current Outpatient Prescriptions on File Prior to Visit  Medication Sig Dispense Refill  . hydrochlorothiazide (HYDRODIURIL) 25 MG tablet Take 1 tablet (25 mg total) by mouth daily. 30 tablet 1  . pravastatin (PRAVACHOL) 40 MG tablet Take 1 tablet (40 mg total) by mouth daily. 30 tablet 2   No current facility-administered medications on file prior to visit.    No Known Allergies Family History  Problem Relation Age of Onset  . Cancer Mother   . Heart disease Mother   . Hyperlipidemia Mother   . Heart disease Father   . Heart  disease Brother   . Diabetes Brother    Social History   Social History  . Marital status: Single    Spouse name: N/A  . Number of children: N/A  . Years of education: N/A   Social History Main Topics  . Smoking status: Never Smoker  . Smokeless tobacco: Never Used  . Alcohol use None  . Drug use: Unknown  . Sexual activity: Not Asked   Other Topics Concern  . None   Social History Narrative  . None   Depression screen [facility] 2/9 03/17/2017 02/17/2017 01/28/2016 10/24/2015  Decreased Interest 0 0 0 0  Down, Depressed, Hopeless 0 0 0 0  PHQ - 2 Score 0 0 0 0     Review of Systems  Constitutional: Positive for activity change. Negative for appetite change, chills, diaphoresis and fever.  Eyes: Negative for visual disturbance.  Respiratory: Negative for cough and shortness of breath.   Cardiovascular: Negative for chest pain, palpitations and leg swelling.  Gastrointestinal: Negative for abdominal pain, nausea and vomiting.  Genitourinary: Negative for decreased urine volume.  Musculoskeletal: Positive for arthralgias. Negative for gait problem, joint swelling and myalgias.  Neurological: Negative for dizziness, syncope, light-headedness and headaches.  Hematological: Does not bruise/bleed easily.       Objective:   Physical Exam  Constitutional:  She is oriented to person, place, and time. She appears well-developed and well-nourished. No distress.  HENT:  Head: Normocephalic and atraumatic.  Right Ear: External ear normal.  Left Ear: External ear normal.  Eyes: Conjunctivae are normal. No scleral icterus.  Neck: Normal range of motion. Neck supple. No thyromegaly present.  Cardiovascular: Normal rate, regular rhythm, normal heart sounds and intact distal pulses.   Pulmonary/Chest: Effort normal and breath sounds normal. No respiratory distress.  Musculoskeletal: She exhibits no edema.  Lymphadenopathy:    She has no cervical adenopathy.  Neurological: She is alert and  oriented to person, place, and time.  Skin: Skin is warm and dry. She is not diaphoretic. No erythema.  Psychiatric: She has a normal mood and affect. Her behavior is normal.      BP 137/85   Pulse 71   Temp 98.1 F (36.7 C) (Oral)   Resp 16   Ht '5\' 5"'$  (1.651 m)   Wt 137 lb (62.1 kg)   SpO2 99%   BMI 22.80 kg/m      Assessment & Plan:   1. Essential hypertension - much improved on hctz 25 - cont; bmp nml; refill x 1 yr when requested  2. History of thyroid nodule - tsh slightly elev, recheck at [facility] in 6 mos  3. Hyperlipidemia, unspecified hyperlipidemia type - tolerating pravastatin started 1 mo prior, lfts nml. Refill x 1 yr whenever requested  4. Medication monitoring encounter   5. Ischial pain, left - improving, cont rest and ice.    F/u in 6 mos for CPE with fasting labs  Orders Placed This Encounter  Procedures  . Pneumococcal conjugate vaccine 13-valent IM  . Comprehensive metabolic panel  . TSH     [name], M.D.  Primary Care at [facility] [address] [place], [place] [address] [phone] phone [phone] fax  03/18/17 10:36 AM

## 2017-03-17 NOTE — Progress Notes (Deleted)
   Subjective:    Patient ID: Erin Gay, female    DOB: 07/23/1946, 70 y.o.   MRN: [id number]  HPI.Erin Gay is a delightful 70-year-old woman who I first met 6 weeks ago when she presented for left ischial tuberosity pain.  Left ischial pain: I suspected this was due to strain at hamstring insertion and rec pt to start scheduled nsaid meloxicam 7.5 bid and ice while avoiding severely aggravating activities.  HTN: We noted at that time that her BP was very elevated at 172/98 despite a low salt diet so started pt on hctz though she was very reluctant.  HLD: new diagnosis on today's labs, ASCVD risk 12.4% so rec start pravastatin 40. Even if BP were well-controlled on meds, statin would still be indicated with ASCVD risk of 10.8%.  H/o thyroid nodule: s/p surgical removal so rec tsh w/ next labs and cons thyroid US. Review of Systems     Objective:   Physical Exam        Assessment & Plan:  cmp, tsh 

## 2017-03-17 NOTE — Patient Instructions (Signed)
     IF you received an x-ray today, you will receive an invoice from [facility]. Please contact [facility] at [phone] with questions or concerns regarding your invoice.   IF you received labwork today, you will receive an invoice from [facility]. Please contact [facility] at [phone] with questions or concerns regarding your invoice.   Our billing staff will not be able to assist you with questions regarding bills from these companies.  You will be contacted with the lab results as soon as they are available. The fastest way to get your results is to activate your My Chart account. Instructions are located on the last page of this paperwork. If you have not heard from us regarding the results in 2 weeks, please contact this office.     

## 2017-03-18 ENCOUNTER — Encounter: Payer: Self-pay | Admitting: Family Medicine

## 2017-03-18 LAB — COMPREHENSIVE METABOLIC PANEL
ALK PHOS: 68 IU/L (ref 39–117)
ALT: 26 IU/L (ref 0–32)
AST: 22 IU/L (ref 0–40)
Albumin/Globulin Ratio: 1.8 (ref 1.2–2.2)
Albumin: 4.6 g/dL (ref 3.6–4.8)
BILIRUBIN TOTAL: 1 mg/dL (ref 0.0–1.2)
BUN/Creatinine Ratio: 23 (ref 12–28)
BUN: 17 mg/dL (ref 8–27)
CHLORIDE: 99 mmol/L (ref 96–106)
CO2: 25 mmol/L (ref 18–29)
Calcium: 9.4 mg/dL (ref 8.7–10.3)
Creatinine, Ser: 0.73 mg/dL (ref 0.57–1.00)
GFR calc Af Amer: 97 mL/min/{1.73_m2} (ref >59)
GFR calc non Af Amer: 84 mL/min/{1.73_m2} (ref >59)
GLUCOSE: 76 mg/dL (ref 65–99)
Globulin, Total: 2.6 g/dL (ref 1.5–4.5)
Potassium: 3.7 mmol/L (ref 3.5–5.2)
Sodium: 141 mmol/L (ref 134–144)
Total Protein: 7.2 g/dL (ref 6.0–8.5)

## 2017-03-18 LAB — TSH: TSH: 5 u[IU]/mL — AB (ref 0.450–4.500)

## 2017-03-18 NOTE — Progress Notes (Signed)
This encounter was created in error - please disregard.

## 2017-03-20 LAB — T4, FREE: FREE T4: 1.06 ng/dL (ref 0.82–1.77)

## 2017-03-20 LAB — SPECIMEN STATUS REPORT

## 2017-03-20 LAB — T3, FREE: T3 FREE: 3.1 pg/mL (ref 2.0–4.4)

## 2017-04-10 ENCOUNTER — Other Ambulatory Visit: Payer: Self-pay | Admitting: Family Medicine

## 2017-06-06 ENCOUNTER — Telehealth: Payer: Self-pay | Admitting: Family Medicine

## 2017-06-06 MED ORDER — PRAVASTATIN SODIUM 40 MG PO TABS: 40.0000 mg | ORAL_TABLET | Freq: Every day | ORAL | 1 refills | Status: DC

## 2017-06-06 NOTE — Telephone Encounter (Signed)
PATIENT STATES SHE WANTS DR. SHAW TO KNOW THAT SHE NEEDS A REFILL ON HER PRAVASTATIN 40 MG. SHE SAID SHE TOOK THE BOTTLE TO [facility] ON [address] ON 05/20/2017. THE PHARMACIST TOLD HER THAT THEY ALSO SENT REQUEST OVER ON 05/21/17, 05/23/17 AND 05/28/17 WITH NO RESPONSE. SHE STATES SHE HAS BEEN OUT OF HER MEDICINE FOR 1 1/2 WEEKS. SHE WOULD LIKE A CALL BACK AS SOON AS POSSIBLE. BEST PHONE [phone] (HOME) [place]. Jaconita

## 2017-06-06 NOTE — Telephone Encounter (Signed)
Please let pt know that I suspect they have been sending it to the wrong dr. Brigitte Pulse - there are severe primary care dr. Raul Del in town and there is not a single request in her chart.  I 'm sorry about the confusion and glad she called. I have sent in the rx to her pharmacy and will look forward to seeing her at her physical in Oct with fasting labs to see how her chol has responded.

## 2017-06-06 NOTE — Telephone Encounter (Signed)
Spoke with patient and advised that her rx was sent to the pharmacy.

## 2017-09-04 ENCOUNTER — Other Ambulatory Visit: Payer: Self-pay | Admitting: Family Medicine

## 2017-09-08 ENCOUNTER — Encounter: Payer: Self-pay | Admitting: Internal Medicine

## 2017-09-08 ENCOUNTER — Ambulatory Visit: Payer: Medicare Other

## 2017-09-08 VITALS — HR 87 | Temp 97.7°F | Ht 65.0 in | Wt 134.4 lb

## 2017-09-08 DIAGNOSIS — E2839 Other primary ovarian failure: Secondary | ICD-10-CM

## 2017-09-08 DIAGNOSIS — Z1211 Encounter for screening for malignant neoplasm of colon: Secondary | ICD-10-CM

## 2017-09-08 DIAGNOSIS — Z0000 Encounter for general adult medical examination without abnormal findings: Secondary | ICD-10-CM

## 2017-09-08 NOTE — Progress Notes (Signed)
Subjective:   Erin Gay is a 71 y.o. female who presents for an Initial Medicare Annual Wellness Visit.  Review of Systems    N/A  Cardiac Risk Factors include: advanced age (>45men, >51 women);hypertension;dyslipidemia     Objective:    Today's Vitals   09/08/17 0830  Pulse: 87  Temp: 97.7 F (36.5 C)  TempSrc: Oral  Weight: 134 lb 6 oz (61 kg)  Height: 5\' 5"  (1.651 m)   Body mass index is 22.36 kg/m.   Current Medications (verified) Outpatient Encounter Prescriptions as of 09/08/2017  Medication Sig  . hydrochlorothiazide (HYDRODIURIL) 25 MG tablet TAKE 1 TABLET BY MOUTH  DAILY  . pravastatin (PRAVACHOL) 40 MG tablet Take 1 tablet (40 mg total) by mouth daily.   No facility-administered encounter medications on file as of 09/08/2017.     Allergies (verified) Patient has no known allergies.   History: Past Medical History:  Diagnosis Date  . Thyroid disease    Past Surgical History:  Procedure Laterality Date  . BREAST SURGERY    . COSMETIC SURGERY    . EYE SURGERY     Family History  Problem Relation Age of Onset  . Cancer Mother   . Heart disease Mother   . Hyperlipidemia Mother   . Heart disease Father   . Heart disease Brother   . Diabetes Brother    Social History   Occupational History  . Not on file.   Social History Main Topics  . Smoking status: Never Smoker  . Smokeless tobacco: Never Used  . Alcohol use No  . Drug use: Unknown  . Sexual activity: Not on file    Tobacco Counseling Counseling given: Not Answered   Activities of Daily Living In your present state of health, do you have any difficulty performing the following activities: 09/08/2017  Hearing? N  Vision? N  Difficulty concentrating or making decisions? N  Walking or climbing stairs? Y  Comment Patient states that she gets short of breath with walking and climbing at times.  Dressing or bathing? N  Doing errands, shopping? N  Preparing Food and eating ?  N  Using the Toilet? N  In the past six months, have you accidently leaked urine? N  Do you have problems with loss of bowel control? N  Managing your Medications? N  Managing your Finances? N  Housekeeping or managing your Housekeeping? N  Some recent data might be hidden    Immunizations and Health Maintenance Immunization History  Administered Date(s) Administered  . Influenza,inj,Quad PF,6+ Mos 10/24/2015  . Pneumococcal Conjugate-13 03/17/2017   Health Maintenance Due  Topic Date Due  . MAMMOGRAM  03/23/1997  . COLONOSCOPY  03/23/1997  . DEXA SCAN  03/23/2012    Patient Care Team: [name], MD as PCP - General (Family Medicine)  Indicate any recent Medical Services you may have received from other than [facility] providers in the past year (date may be approximate).     Assessment:   This is a routine wellness examination for [facility].   Hearing/Vision screen Vision Screening Comments: Patient does not see an eye doctor on regular basis.  Dietary issues and exercise activities discussed: Current Exercise Habits: The patient does not participate in regular exercise at present, Exercise limited by: None identified  Goals    . exercise at least 2 times a week          Patient states that she will try to start back walking at  least 2 times a week for about 1 hour.      Depression Screen PHQ 2/9 Scores 09/08/2017 03/17/2017 02/17/2017 01/28/2016 10/24/2015  PHQ - 2 Score 0 0 0 0 0    Fall Risk Fall Risk  09/08/2017 03/17/2017 02/17/2017 01/28/2016  Falls in the past year? No No No No    Cognitive Function:     6CIT Screen 09/08/2017  What Year? 0 points  What month? 0 points  What time? 0 points  Count back from 20 0 points  Months in reverse 0 points  Repeat phrase 0 points  Total Score 0    Screening Tests Health Maintenance  Topic Date Due  . MAMMOGRAM  03/23/1997  . COLONOSCOPY  03/23/1997  . DEXA SCAN  03/23/2012  . INFLUENZA VACCINE  09/08/2018 (Originally  07/16/2017)  . Hepatitis C Screening  09/08/2018 (Originally 06-09-1946)  . TETANUS/TDAP  09/09/2027 (Originally 03/23/1966)  . PNA vac Low Risk Adult (2 of 2 - PPSV23) 03/17/2018      Plan:   I have personally reviewed and noted the following in the patient's chart:   . Medical and social history . Use of alcohol, tobacco or illicit drugs  . Current medications and supplements . Functional ability and status . Nutritional status . Physical activity . Advanced directives . List of other physicians . Hospitalizations, surgeries, and ER visits in previous 12 months . Vitals . Screenings to include cognitive, depression, and falls . Referrals and appointments  In addition, I have reviewed and discussed with patient certain preventive protocols, quality metrics, and best practice recommendations. A written personalized care plan for preventive services as well as general preventive health recommendations were provided to patient.  Patient declined flu vaccine. Patient has mammogram already scheduled. Colonoscopy referral and Bone density ordered. Patient wants lab work ordered next week by Dr. Brigitte Pulse.    Andrez Grime, LPN   [id number]

## 2017-09-08 NOTE — Patient Instructions (Addendum)
Erin Gay , Thank you for taking time to come for your Medicare Wellness Visit. I appreciate your ongoing commitment to your health goals. Please review the following plan we discussed and let me know if I can assist you in the future.   Screening recommendations/referrals: Colonoscopy: due, referral ordered, GI will contact you  Mammogram: due, You have this scheduled with another doctor. Bone Density: due, referral ordered, [facility] will contact you Recommended yearly ophthalmology/optometry visit for glaucoma screening and checkup Recommended yearly dental visit for hygiene and checkup  Vaccinations: Influenza vaccine: declined  Pneumococcal vaccine: up to date, Pneumovax 23 after 03/17/2018 Tdap vaccine: declined due to insurance Shingles vaccine: declined   Advanced directives: Advance directive discussed with you today. Even though you declined this today please call our office should you change your mind and we can give you the proper paperwork for you to fill out.   Conditions/risks identified: Try to start back walking at least 2 times a week for about 1 hour.   Next appointment: 09/18/2017 @ 2 pm with Dr. Brigitte Pulse    Preventive Care 65 Years and Older, Female Preventive care refers to lifestyle choices and visits with your health care provider that can promote health and wellness. What does preventive care include?  A yearly physical exam. This is also called an annual well check.  Dental exams once or twice a year.  Routine eye exams. Ask your health care provider how often you should have your eyes checked.  Personal lifestyle choices, including:  Daily care of your teeth and gums.  Regular physical activity.  Eating a healthy diet.  Avoiding tobacco and drug use.  Limiting alcohol use.  Practicing safe sex.  Taking low-dose aspirin every day.  Taking vitamin and mineral supplements as recommended by your health care provider. What happens during an  annual well check? The services and screenings done by your health care provider during your annual well check will depend on your age, overall health, lifestyle risk factors, and family history of disease. Counseling  Your health care provider may ask you questions about your:  Alcohol use.  Tobacco use.  Drug use.  Emotional well-being.  Home and relationship well-being.  Sexual activity.  Eating habits.  History of falls.  Memory and ability to understand (cognition).  Work and work Statistician.  Reproductive health. Screening  You may have the following tests or measurements:  Height, weight, and BMI.  Blood pressure.  Lipid and cholesterol levels. These may be checked every 5 years, or more frequently if you are over 32 years old.  Skin check.  Lung cancer screening. You may have this screening every year starting at age 56 if you have a 30-pack-year history of smoking and currently smoke or have quit within the past 15 years.  Fecal occult blood test (FOBT) of the stool. You may have this test every year starting at age 63.  Flexible sigmoidoscopy or colonoscopy. You may have a sigmoidoscopy every 5 years or a colonoscopy every 10 years starting at age 54.  Hepatitis C blood test.  Hepatitis B blood test.  Sexually transmitted disease (STD) testing.  Diabetes screening. This is done by checking your blood sugar (glucose) after you have not eaten for a while (fasting). You may have this done every 1-3 years.  Bone density scan. This is done to screen for osteoporosis. You may have this done starting at age 71.  Mammogram. This may be done every 1-2 years. Talk to your  health care provider about how often you should have regular mammograms. Talk with your health care provider about your test results, treatment options, and if necessary, the need for more tests. Vaccines  Your health care provider may recommend certain vaccines, such as:  Influenza  vaccine. This is recommended every year.  Tetanus, diphtheria, and acellular pertussis (Tdap, Td) vaccine. You may need a Td booster every 10 years.  Zoster vaccine. You may need this after age 38.  Pneumococcal 13-valent conjugate (PCV13) vaccine. One dose is recommended after age 45.  Pneumococcal polysaccharide (PPSV23) vaccine. One dose is recommended after age 80. Talk to your health care provider about which screenings and vaccines you need and how often you need them. This information is not intended to replace advice given to you by your health care provider. Make sure you discuss any questions you have with your health care provider. Document Released: 12/29/2015 Document Revised: 08/21/2016 Document Reviewed: 10/03/2015 Elsevier Interactive Patient Education  2017 [place] Prevention in the Home Falls can cause injuries. They can happen to people of all ages. There are many things you can do to make your home safe and to help prevent falls. What can I do on the outside of my home?  Regularly fix the edges of walkways and driveways and fix any cracks.  Remove anything that might make you trip as you walk through a door, such as a raised step or threshold.  Trim any bushes or trees on the path to your home.  Use bright outdoor lighting.  Clear any walking paths of anything that might make someone trip, such as rocks or tools.  Regularly check to see if handrails are loose or broken. Make sure that both sides of any steps have handrails.  Any raised decks and porches should have guardrails on the edges.  Have any leaves, snow, or ice cleared regularly.  Use sand or salt on walking paths during winter.  Clean up any spills in your garage right away. This includes oil or grease spills. What can I do in the bathroom?  Use night lights.  Install grab bars by the toilet and in the tub and shower. Do not use towel bars as grab bars.  Use non-skid mats or decals  in the tub or shower.  If you need to sit down in the shower, use a plastic, non-slip stool.  Keep the floor dry. Clean up any water that spills on the floor as soon as it happens.  Remove soap buildup in the tub or shower regularly.  Attach bath mats securely with double-sided non-slip rug tape.  Do not have throw rugs and other things on the floor that can make you trip. What can I do in the bedroom?  Use night lights.  Make sure that you have a light by your bed that is easy to reach.  Do not use any sheets or blankets that are too big for your bed. They should not hang down onto the floor.  Have a firm chair that has side arms. You can use this for support while you get dressed.  Do not have throw rugs and other things on the floor that can make you trip. What can I do in the kitchen?  Clean up any spills right away.  Avoid walking on wet floors.  Keep items that you use a lot in easy-to-reach places.  If you need to reach something above you, use a strong step stool that has a  grab bar.  Keep electrical cords out of the way.  Do not use floor polish or wax that makes floors slippery. If you must use wax, use non-skid floor wax.  Do not have throw rugs and other things on the floor that can make you trip. What can I do with my stairs?  Do not leave any items on the stairs.  Make sure that there are handrails on both sides of the stairs and use them. Fix handrails that are broken or loose. Make sure that handrails are as long as the stairways.  Check any carpeting to make sure that it is firmly attached to the stairs. Fix any carpet that is loose or worn.  Avoid having throw rugs at the top or bottom of the stairs. If you do have throw rugs, attach them to the floor with carpet tape.  Make sure that you have a light switch at the top of the stairs and the bottom of the stairs. If you do not have them, ask someone to add them for you. What else can I do to help  prevent falls?  Wear shoes that:  Do not have high heels.  Have rubber bottoms.  Are comfortable and fit you well.  Are closed at the toe. Do not wear sandals.  If you use a stepladder:  Make sure that it is fully opened. Do not climb a closed stepladder.  Make sure that both sides of the stepladder are locked into place.  Ask someone to hold it for you, if possible.  Clearly mark and make sure that you can see:  Any grab bars or handrails.  First and last steps.  Where the edge of each step is.  Use tools that help you move around (mobility aids) if they are needed. These include:  Canes.  Walkers.  Scooters.  Crutches.  Turn on the lights when you go into a dark area. Replace any light bulbs as soon as they burn out.  Set up your furniture so you have a clear path. Avoid moving your furniture around.  If any of your floors are uneven, fix them.  If there are any pets around you, be aware of where they are.  Review your medicines with your doctor. Some medicines can make you feel dizzy. This can increase your chance of falling. Ask your doctor what other things that you can do to help prevent falls. This information is not intended to replace advice given to you by your health care provider. Make sure you discuss any questions you have with your health care provider. Document Released: 09/28/2009 Document Revised: 05/09/2016 Document Reviewed: 01/06/2015 Elsevier Interactive Patient Education  2017 [employer].

## 2017-09-18 ENCOUNTER — Encounter: Payer: Self-pay | Admitting: Family Medicine

## 2017-09-18 ENCOUNTER — Ambulatory Visit (INDEPENDENT_AMBULATORY_CARE_PROVIDER_SITE_OTHER): Payer: Medicare Other | Admitting: Family Medicine

## 2017-09-18 VITALS — BP 134/70 | HR 73 | Temp 98.2°F | Resp 18 | Ht 65.0 in | Wt 135.6 lb

## 2017-09-18 DIAGNOSIS — M899 Disorder of bone, unspecified: Secondary | ICD-10-CM

## 2017-09-18 DIAGNOSIS — E039 Hypothyroidism, unspecified: Secondary | ICD-10-CM

## 2017-09-18 DIAGNOSIS — E785 Hyperlipidemia, unspecified: Secondary | ICD-10-CM | POA: Diagnosis not present

## 2017-09-18 DIAGNOSIS — R1310 Dysphagia, unspecified: Secondary | ICD-10-CM | POA: Diagnosis not present

## 2017-09-18 DIAGNOSIS — Z0000 Encounter for general adult medical examination without abnormal findings: Secondary | ICD-10-CM

## 2017-09-18 DIAGNOSIS — R5383 Other fatigue: Secondary | ICD-10-CM | POA: Diagnosis not present

## 2017-09-18 DIAGNOSIS — Z8639 Personal history of other endocrine, nutritional and metabolic disease: Secondary | ICD-10-CM | POA: Diagnosis not present

## 2017-09-18 DIAGNOSIS — I10 Essential (primary) hypertension: Secondary | ICD-10-CM | POA: Diagnosis not present

## 2017-09-18 DIAGNOSIS — E559 Vitamin D deficiency, unspecified: Secondary | ICD-10-CM

## 2017-09-18 DIAGNOSIS — E038 Other specified hypothyroidism: Secondary | ICD-10-CM

## 2017-09-18 DIAGNOSIS — R1319 Other dysphagia: Secondary | ICD-10-CM

## 2017-09-18 LAB — POCT URINALYSIS DIP (MANUAL ENTRY)
GLUCOSE UA: NEGATIVE mg/dL
NITRITE UA: NEGATIVE
PH UA: 5.5 (ref 5.0–8.0)
Protein Ur, POC: NEGATIVE mg/dL
Spec Grav, UA: >=1.03 — AB (ref 1.010–1.025)
UROBILINOGEN UA: 0.2 U/dL

## 2017-09-18 MED ORDER — OMEPRAZOLE 40 MG PO CPDR: 40.0000 mg | DELAYED_RELEASE_CAPSULE | Freq: Every day | ORAL | 3 refills | Status: DC

## 2017-09-18 NOTE — Patient Instructions (Addendum)
We recommend that you schedule a dexa bone scan for osteoporosis screening. Typically, you do not need a referral to do this. Please contact a local imaging center to schedule your dexa bone scann. [facility] ([place]) - [phone] or [phone]   Make sure you are taking a daily calcium/vitamin D supplement - and twice a day would be great. Try to find a chewable calcium CITRATE supplement with 400 to 600 mg of calcium in it and as much vitamin D as you can.    IF you received an x-ray today, you will receive an invoice from [facility]. Please contact [facility] at [phone] with questions or concerns regarding your invoice.   IF you received labwork today, you will receive an invoice from [facility]. Please contact [facility] at [phone] with questions or concerns regarding your invoice.   Our billing staff will not be able to assist you with questions regarding bills from these companies.  You will be contacted with the lab results as soon as they are available. The fastest way to get your results is to activate your My Chart account. Instructions are located on the last page of this paperwork. If you have not heard from [place] regarding the results in 2 weeks, please contact this office.     Bone Health Bones protect organs, store calcium, and anchor muscles. Good health habits, such as eating nutritious foods and exercising regularly, are important for maintaining healthy bones. They can also help to prevent a condition that causes bones to lose density and become weak and brittle (osteoporosis). Why is bone mass important? Bone mass refers to the amount of bone tissue that you have. The higher your bone mass, the stronger your bones. An important step toward having healthy bones throughout life is to have strong and dense bones during childhood. A young adult who has a high bone mass is more likely to have a high bone mass later in life. Bone  mass at its greatest it is called peak bone mass. A large decline in bone mass occurs in older adults. In women, it occurs about the time of menopause. During this time, it is important to practice good health habits, because if more bone is lost than what is replaced, the bones will become less healthy and more likely to break (fracture). If you find that you have a low bone mass, you may be able to prevent osteoporosis or further bone loss by changing your diet and lifestyle. How can I find out if my bone mass is low? Bone mass can be measured with an X-ray test that is called a bone mineral density (BMD) test. This test is recommended for all women who are age 8 or older. It may also be recommended for men who are age 108 or older, or for people who are more likely to develop osteoporosis due to:  Having bones that break easily.  Having a long-term disease that weakens bones, such as kidney disease or rheumatoid arthritis.  Having menopause earlier than normal.  Taking medicine that weakens bones, such as steroids, thyroid hormones, or hormone treatment for breast cancer or prostate cancer.  Smoking.  Drinking three or more alcoholic drinks each day.  What are the nutritional recommendations for healthy bones? To have healthy bones, you need to get enough of the right minerals and vitamins. Most nutrition experts recommend getting these nutrients from the foods that you eat. Nutritional recommendations vary from person to person. Ask your health  care provider what is healthy for you. Here are some general guidelines. Calcium Recommendations Calcium is the most important (essential) mineral for bone health. Most people can get enough calcium from their diet, but supplements may be recommended for people who are at risk for osteoporosis. Good sources of calcium include:  Dairy products, such as low-fat or nonfat milk, cheese, and yogurt.  Dark green leafy vegetables, such as bok choy and  broccoli.  Calcium-fortified foods, such as orange juice, cereal, bread, soy beverages, and tofu products.  Nuts, such as almonds.  Follow these recommended amounts for daily calcium intake:  Children, age 57?3: 700 mg.  Children, age 62?8: 1,000 mg.  Children, age 69?13: 1,300 mg.  Teens, age 22?18: 1,300 mg.  Adults, age 626?50: 1,000 mg.  Adults, age 62?70: ? Men: 1,000 mg. ? Women: 1,200 mg.  Adults, age 86 or older: 1,200 mg.  Pregnant and breastfeeding females: ? Teens: 1,300 mg. ? Adults: 1,000 mg.  Vitamin D Recommendations Vitamin D is the most essential vitamin for bone health. It helps the body to absorb calcium. Sunlight stimulates the skin to make vitamin D, so be sure to get enough sunlight. If you live in a cold climate or you do not get outside often, your health care provider may recommend that you take vitamin D supplements. Good sources of vitamin D in your diet include:  Egg yolks.  Saltwater fish.  Milk and cereal fortified with vitamin D.  Follow these recommended amounts for daily vitamin D intake:  Children and teens, age 571?18: 54 international units.  Adults, age 622 or younger: 400-800 international units.  Adults, age 22 or older: 800-1,000 international units.  Other Nutrients Other nutrients for bone health include:  Phosphorus. This mineral is found in meat, poultry, dairy foods, nuts, and legumes. The recommended daily intake for adult men and adult women is 700 mg.  Magnesium. This mineral is found in seeds, nuts, dark green vegetables, and legumes. The recommended daily intake for adult men is 400?420 mg. For adult women, it is 310?320 mg.  Vitamin K. This vitamin is found in green leafy vegetables. The recommended daily intake is 120 mg for adult men and 90 mg for adult women.  What type of physical activity is best for building and maintaining healthy bones? Weight-bearing and strength-building activities are important for building  and maintaining peak bone mass. Weight-bearing activities cause muscles and bones to work against gravity. Strength-building activities increases muscle strength that supports bones. Weight-bearing and muscle-building activities include:  Walking and hiking.  Jogging and running.  Dancing.  Gym exercises.  Lifting weights.  Tennis and racquetball.  Climbing stairs.  Aerobics.  Adults should get at least 30 minutes of moderate physical activity on most days. Children should get at least 60 minutes of moderate physical activity on most days. Ask your health care provide what type of exercise is best for you. Where can I find more information? For more information, check out the following websites:  [place]: YardHomes.se  [employer] of Health: http://www.niams.AnonymousEar.fr.asp  This information is not intended to replace advice given to you by your health care provider. Make sure you discuss any questions you have with your health care provider. Document Released: 02/22/2004 Document Revised: 06/21/2016 Document Reviewed: 12/07/2014 Elsevier Interactive Patient Education  2018 [place].  Calcium Intake Recommendations Calcium is a mineral that affects many functions in the body, including:  Blood clotting.  Blood vessel function.  Nerve impulse conduction.  Hormone secretion.  Muscle contraction.  Bone and teeth functions.  Most of your body's calcium supply is stored in your bones and teeth. When your calcium stores are low, you may be at risk for low bone mass, bone loss, and bone fractures. Consuming enough calcium helps to grow healthy bones and teeth and to prevent breakdown over time. It is very important that you get enough calcium if you are:  A child undergoing rapid growth.  An adolescent girl.  A pre- or post-menopausal woman.  A woman whose menstrual cycle  has stopped due to anorexia nervosa or regular intense exercise.  An individual with lactose intolerance or a milk allergy.  A vegetarian.  What is my plan? Try to consume the recommended amount of calcium daily based on your age. Depending on your overall health, your health care provider may recommend increased calcium intake.General daily calcium intake recommendations by age are:  Birth to 6 months: 200 mg.  Infants 7 to 12 months: 260 mg.  Children 1 to 3 years: 700 mg.  Children 4 to 8 years: 1,000 mg.  Children 9 to 13 years: 1,300 mg.  Teens 14 to 18 years: 1,300 mg.  Adults 19 to 50 years: 1,000 mg.  Adult women 51 to 70 years: 1,200 mg.  Adult men 51 to 70 years: 1,000 mg.  Adults 71 years and older: 1,200 mg.  Pregnant and breastfeeding teens: 1,300 mg.  Pregnant and breastfeeding adults: 1,000 mg.  What do I need to know about calcium intake?  In order for the body to absorb calcium, it needs vitamin D. You can get vitamin D through: ? Direct exposure of the skin to sunlight. ? Foods, such as egg yolks, liver, saltwater fish, and fortified milk. ? Supplements.  Consuming too much calcium may cause: ? Constipation. ? Decreased absorption of iron and zinc. ? Kidney stones.  Calcium supplements may interact with certain medicines. Check with your health care provider before starting any calcium supplements.  Try to get most of your calcium from food. What foods can I eat? Grains  Fortified oatmeal. Fortified ready-to-eat cereals. Fortified frozen waffles. Vegetables Turnip greens. Broccoli. Fruits Fortified orange juice. Meats and Other Protein Sources Canned sardines with bones. Canned salmon with bones. Soy beans. Tofu. Baked beans. Almonds. [place] nuts. Sunflower seeds. Dairy Milk. Yogurt. Cheese. Cottage cheese. Beverages Fortified soy milk. Fortified rice milk. Sweets/Desserts Pudding. Ice Cream. Milkshakes. Blackstrap molasses. The items  listed above may not be a complete list of recommended foods or beverages. Contact your dietitian for more options. What foods can affect my calcium intake? It may be more difficult for your body to use calcium or calcium may leave your body more quickly if you consume large amounts of:  Sodium.  Protein.  Caffeine.  Alcohol.  This information is not intended to replace advice given to you by your health care provider. Make sure you discuss any questions you have with your health care provider. Document Released: 07/16/2004 Document Revised: 06/21/2016 Document Reviewed: 05/10/2014 Elsevier Interactive Patient Education  2018 [employer].
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
## 2017-09-19 LAB — CBC WITH DIFFERENTIAL/PLATELET
BASOS ABS: 0 10*3/uL (ref 0.0–0.2)
BASOS: 0 %
EOS (ABSOLUTE): 0.2 10*3/uL (ref 0.0–0.4)
Eos: 2 %
HEMOGLOBIN: 13.5 g/dL (ref 11.1–15.9)
Hematocrit: 39.5 % (ref 34.0–46.6)
Immature Grans (Abs): 0 10*3/uL (ref 0.0–0.1)
Immature Granulocytes: 0 %
LYMPHS ABS: 2.3 10*3/uL (ref 0.7–3.1)
LYMPHS: 32 %
MCH: 31 pg (ref 26.6–33.0)
MCHC: 34.2 g/dL (ref 31.5–35.7)
MCV: 91 fL (ref 79–97)
MONOS ABS: 0.9 10*3/uL (ref 0.1–0.9)
Monocytes: 13 %
Neutrophils Absolute: 3.7 10*3/uL (ref 1.4–7.0)
Neutrophils: 53 %
PLATELETS: 298 10*3/uL (ref 150–379)
RBC: 4.35 x10E6/uL (ref 3.77–5.28)
RDW: 13.4 % (ref 12.3–15.4)
WBC: 7.1 10*3/uL (ref 3.4–10.8)

## 2017-09-19 LAB — THYROID PANEL WITH TSH
Free Thyroxine Index: 1.7 (ref 1.2–4.9)
T3 UPTAKE RATIO: 23 % — AB (ref 24–39)
T4 TOTAL: 7.2 ug/dL (ref 4.5–12.0)
TSH: 6.2 u[IU]/mL — ABNORMAL HIGH (ref 0.450–4.500)

## 2017-09-19 LAB — COMPREHENSIVE METABOLIC PANEL
ALT: 21 IU/L (ref 0–32)
AST: 24 IU/L (ref 0–40)
Albumin/Globulin Ratio: 1.8 (ref 1.2–2.2)
Albumin: 4.7 g/dL (ref 3.5–4.8)
Alkaline Phosphatase: 56 IU/L (ref 39–117)
BUN/Creatinine Ratio: 25 (ref 12–28)
BUN: 21 mg/dL (ref 8–27)
Bilirubin Total: 1.1 mg/dL (ref 0.0–1.2)
CALCIUM: 9.9 mg/dL (ref 8.7–10.3)
CO2: 22 mmol/L (ref 20–29)
CREATININE: 0.83 mg/dL (ref 0.57–1.00)
Chloride: 102 mmol/L (ref 96–106)
GFR calc Af Amer: 83 mL/min/{1.73_m2} (ref >59)
GFR, EST NON AFRICAN AMERICAN: 72 mL/min/{1.73_m2} (ref >59)
GLOBULIN, TOTAL: 2.6 g/dL (ref 1.5–4.5)
Glucose: 81 mg/dL (ref 65–99)
Potassium: 4.2 mmol/L (ref 3.5–5.2)
SODIUM: 141 mmol/L (ref 134–144)
Total Protein: 7.3 g/dL (ref 6.0–8.5)

## 2017-09-19 LAB — VITAMIN D 25 HYDROXY (VIT D DEFICIENCY, FRACTURES): VIT D 25 HYDROXY: 28.7 ng/mL — AB (ref 30.0–100.0)

## 2017-09-19 LAB — LIPID PANEL
Chol/HDL Ratio: 3.3 ratio (ref 0.0–4.4)
Cholesterol, Total: 234 mg/dL — ABNORMAL HIGH (ref 100–199)
HDL: 70 mg/dL (ref >39)
LDL Calculated: 143 mg/dL — ABNORMAL HIGH (ref 0–99)
TRIGLYCERIDES: 103 mg/dL (ref 0–149)
VLDL Cholesterol Cal: 21 mg/dL (ref 5–40)

## 2017-09-29 DIAGNOSIS — Z01411 Encounter for gynecological examination (general) (routine) with abnormal findings: Secondary | ICD-10-CM | POA: Diagnosis not present

## 2017-09-29 DIAGNOSIS — Z1231 Encounter for screening mammogram for malignant neoplasm of breast: Secondary | ICD-10-CM | POA: Diagnosis not present

## 2017-09-29 DIAGNOSIS — L900 Lichen sclerosus et atrophicus: Secondary | ICD-10-CM | POA: Diagnosis not present

## 2017-09-29 DIAGNOSIS — Z124 Encounter for screening for malignant neoplasm of cervix: Secondary | ICD-10-CM | POA: Diagnosis not present

## 2017-10-01 ENCOUNTER — Other Ambulatory Visit: Payer: Self-pay | Admitting: Family Medicine

## 2017-10-01 ENCOUNTER — Ambulatory Visit (INDEPENDENT_AMBULATORY_CARE_PROVIDER_SITE_OTHER): Payer: Medicare Other | Admitting: Physician Assistant

## 2017-10-01 DIAGNOSIS — Z23 Encounter for immunization: Secondary | ICD-10-CM

## 2017-10-01 DIAGNOSIS — E2839 Other primary ovarian failure: Secondary | ICD-10-CM

## 2017-10-01 DIAGNOSIS — M8580 Other specified disorders of bone density and structure, unspecified site: Secondary | ICD-10-CM

## 2017-10-20 ENCOUNTER — Ambulatory Visit
Admission: RE | Admit: 2017-10-20 | Discharge: 2017-10-20 | Disposition: A | Discharge status: Home / Self Care | Payer: Medicare Other | Source: Ambulatory Visit | Attending: Family Medicine | Admitting: Family Medicine

## 2017-10-20 DIAGNOSIS — M8589 Other specified disorders of bone density and structure, multiple sites: Secondary | ICD-10-CM | POA: Diagnosis not present

## 2017-10-20 DIAGNOSIS — Z780 Asymptomatic menopausal state: Secondary | ICD-10-CM | POA: Diagnosis not present

## 2017-10-27 ENCOUNTER — Other Ambulatory Visit: Payer: Self-pay

## 2017-10-27 ENCOUNTER — Ambulatory Visit (AMBULATORY_SURGERY_CENTER): Payer: Self-pay | Admitting: *Deleted

## 2017-10-27 ENCOUNTER — Telehealth: Payer: Self-pay | Admitting: *Deleted

## 2017-10-27 VITALS — Ht 65.0 in | Wt 139.0 lb

## 2017-10-27 DIAGNOSIS — R1310 Dysphagia, unspecified: Secondary | ICD-10-CM

## 2017-10-27 DIAGNOSIS — Z1211 Encounter for screening for malignant neoplasm of colon: Secondary | ICD-10-CM

## 2017-10-27 DIAGNOSIS — R1319 Other dysphagia: Secondary | ICD-10-CM

## 2017-10-27 NOTE — Telephone Encounter (Signed)
Patient had PV today for screening colon on 11/10/17 at 8 am. She had Colon and EGD in 2004 w/Dr.Orr. During the pv patient request having a EGD w/ colon, she states her PCP was to have set this up for her. I did explain that we have received a referral for this. Patient explained and as her PCP notes on 09/18/17 her food has been getting "stuck", explained "dry" foods like rice and chicken and that it happens every time she eats certain foods and has been going on for about 1 year. No problems with liquids. She states she gets frequent hiccups also when eating. Pt denies reflux symptoms. PCP put her on Omeprazole, she took for 1 month and stopped because she didn't feel any better. Patient is requesting EGD, ok to schedule this? Please advise. Thank you, Izaiah Tabb pv

## 2017-10-27 NOTE — Telephone Encounter (Signed)
OK to also schedule an EGD - dx would be esophageal dysphagia   I could do it at 0730 that day - can open that up vs switching to open slots later in the day

## 2017-10-27 NOTE — Telephone Encounter (Signed)
Notified patient of Dr.Gessner's recommendations. She was very pleased and thankful that this could be done at 730 w/ colonoscopy. Appointment made at this time for the [facility] at 730 am.

## 2017-10-27 NOTE — Progress Notes (Signed)
Patient denies any allergies to eggs or soy. Patient denies any problems with anesthesia/sedation. Patient denies any oxygen use at home. Patient denies taking any diet/weight loss medications or blood thinners. EMMI education assisgned to patient on EGD & colonoscopy, this was explained and instructions given to patient.

## 2017-11-10 ENCOUNTER — Encounter: Payer: Self-pay | Admitting: Internal Medicine

## 2017-11-10 ENCOUNTER — Ambulatory Visit (AMBULATORY_SURGERY_CENTER): Payer: Medicare Other | Admitting: Internal Medicine

## 2017-11-10 VITALS — BP 122/69 | HR 68 | Temp 97.3°F | Resp 14 | Ht 65.0 in | Wt 139.0 lb

## 2017-11-10 DIAGNOSIS — Z1211 Encounter for screening for malignant neoplasm of colon: Secondary | ICD-10-CM

## 2017-11-10 DIAGNOSIS — R1310 Dysphagia, unspecified: Secondary | ICD-10-CM

## 2017-11-10 DIAGNOSIS — Z1212 Encounter for screening for malignant neoplasm of rectum: Secondary | ICD-10-CM

## 2017-11-10 DIAGNOSIS — R1319 Other dysphagia: Secondary | ICD-10-CM

## 2017-11-10 MED ORDER — SODIUM CHLORIDE 0.9 % IV SOLN: 500.0000 mL | INTRAVENOUS | Status: DC

## 2017-11-10 NOTE — Op Note (Signed)
[facility] Patient Name: Erin Gay Procedure Date: 11/10/2017 7:32 AM MRN: [id number] Endoscopist: Gatha Mayer , MD Age: 71 Referring MD:  Date of Birth: [birth year] Gender: Female Account #: [id number] Procedure:                Colonoscopy Indications:              Screening for colorectal malignant neoplasm Medicines:                Propofol per Anesthesia, Monitored Anesthesia Care Procedure:                Pre-Anesthesia Assessment:                           - Prior to the procedure, a History and Physical                            was performed, and patient medications and                            allergies were reviewed. The patient's tolerance of                            previous anesthesia was also reviewed. The risks                            and benefits of the procedure and the sedation                            options and risks were discussed with the patient.                            All questions were answered, and informed consent                            was obtained. Prior Anticoagulants: The patient has                            taken no previous anticoagulant or antiplatelet                            agents. ASA Grade Assessment: II - A patient with                            mild systemic disease. After reviewing the risks                            and benefits, the patient was deemed in                            satisfactory condition to undergo the procedure.                           After obtaining informed consent, the colonoscope  was passed under direct vision. Throughout the                            procedure, the patient's blood pressure, pulse, and                            oxygen saturations were monitored continuously. The                            Colonoscope was introduced through the anus and                            advanced to the the cecum, identified by   appendiceal orifice and ileocecal valve. The                            quality of the bowel preparation was excellent. The                            colonoscopy was performed without difficulty. The                            patient tolerated the procedure well. The bowel                            preparation used was Miralax. The ileocecal valve,                            appendiceal orifice, and rectum were photographed. Scope In: 8:00:38 AM Scope Out: 8:12:37 AM Scope Withdrawal Time: 0 hours 10 minutes 19 seconds  Total Procedure Duration: 0 hours 11 minutes 59 seconds  Findings:                 The perianal and digital rectal examinations were                            normal.                           The colon (entire examined portion) appeared normal.                           No additional abnormalities were found on                            retroflexion. Complications:            No immediate complications. Estimated blood loss:                            None. Estimated Blood Loss:     Estimated blood loss: none. Recommendation:           - Patient has a contact number available for                            emergencies. The signs and symptoms  of potential                            delayed complications were discussed with the                            patient. Return to normal activities tomorrow.                            Written discharge instructions were provided to the                            patient.                           - Resume previous diet.                           - Continue present medications.                           - Patient has a contact number available for                            emergencies. The signs and symptoms of potential                            delayed complications were discussed with the                            patient. Return to normal activities tomorrow.                            Written discharge instructions  were provided to the                            patient.                           - Clear liquids x 1 hour then soft foods rest of                            day. Start prior diet tomorrow.                           - No repeat colonoscopy due to age, the absence of                            colonic polyps and no evidence of mucosal or other                            abnormalities on today's exam. Gatha Mayer, MD 11/10/2017 8:25:11 AM This report has been signed electronically.

## 2017-11-10 NOTE — Progress Notes (Signed)
Called to room to assist during endoscopic procedure.  Patient ID and intended procedure confirmed with present staff. Received instructions for my participation in the procedure from the performing physician.  

## 2017-11-10 NOTE — Progress Notes (Signed)
Pt complaining of left eye irritation.Dr. Carlean Purl is aware. Dr. Carlean Purl did exam the left eye.Per Dr. Carlean Purl to apply cool compress . Applied cool compress pt verbalize some relief. Per Dr. Carlean Purl and Randel Pigg CRNA verbalize to try lubricant drops to see if it makes it better. Advised to follow up with Dr. Carlean Purl and primary MD if not feeling any better.

## 2017-11-10 NOTE — Patient Instructions (Addendum)
Both exams looked ok.  I did dilate the esophagus.  Let me know if that does not fix your swallowing problems.  I do not recommend another routine colon cancer screening test in your situation (age 71 and normal exam today)  I appreciate the opportunity to care for you. Gatha Mayer, MD, FACG YOU HAD AN ENDOSCOPIC PROCEDURE TODAY AT [facility]:   Refer to the procedure report that was given to you for any specific questions about what was found during the examination.  If the procedure report does not answer your questions, please call your gastroenterologist to clarify.  If you requested that your care partner not be given the details of your procedure findings, then the procedure report has been included in a sealed envelope for you to review at your convenience later.  YOU SHOULD EXPECT: Some feelings of bloating in the abdomen. Passage of more gas than usual.  Walking can help get rid of the air that was put into your GI tract during the procedure and reduce the bloating. If you had a lower endoscopy (such as a colonoscopy or flexible sigmoidoscopy) you may notice spotting of blood in your stool or on the toilet paper. If you underwent a bowel prep for your procedure, you may not have a normal bowel movement for a few days.  Please Note:  You might notice some irritation and congestion in your nose or some drainage.  This is from the oxygen used during your procedure.  There is no need for concern and it should clear up in a day or so.  SYMPTOMS TO REPORT IMMEDIATELY:   Following lower endoscopy (colonoscopy or flexible sigmoidoscopy):  Excessive amounts of blood in the stool  Significant tenderness or worsening of abdominal pains  Swelling of the abdomen that is new, acute  Fever of 100F or higher   Following upper endoscopy (EGD)  Vomiting of blood or coffee ground material  New chest pain or pain under the shoulder blades  Painful or persistently  difficult swallowing  New shortness of breath  Fever of 100F or higher  Black, tarry-looking stools  For urgent or emergent issues, a gastroenterologist can be reached at any hour by calling [phone].   DIET:  Clear liquids x 1 hour then soft foods for the rest of the day. Start regular diet tomorrow.  Drink plenty of fluids but you should avoid alcoholic beverages for 24 hours.  ACTIVITY:  You should plan to take it easy for the rest of today and you should NOT DRIVE or use heavy machinery until tomorrow (because of the sedation medicines used during the test).    FOLLOW UP: Our staff will call the number listed on your records the next business day following your procedure to check on you and address any questions or concerns that you may have regarding the information given to you following your procedure. If we do not reach you, we will leave a message.  However, if you are feeling well and you are not experiencing any problems, there is no need to return our call.  We will assume that you have returned to your regular daily activities without incident.  If any biopsies were taken you will be contacted by phone or by letter within the next 1-3 weeks.  Please call us at [phone] if you have not heard about the biopsies in 3 weeks.   Clear liquids x 1 hour then soft foods for the rest of  the day. Start regular diet tomorrow. No further Colonoscopy screening needed due to age. If dilation does not help dysphagia consider motility evaluation.  SIGNATURES/CONFIDENTIALITY: You and/or your care partner have signed paperwork which will be entered into your electronic medical record.  These signatures attest to the fact that that the information above on your After Visit Summary has been reviewed and is understood.  Full responsibility of the confidentiality of this discharge information lies with you and/or your care-partner.

## 2017-11-10 NOTE — Op Note (Signed)
[facility] Patient Name: Erin Gay Procedure Date: 11/10/2017 7:33 AM MRN: [id number] Endoscopist: Gatha Mayer , MD Age: 71 Referring MD:  Date of Birth: 04-03-1946 Gender: Female Account #: [id number] Procedure:                Upper GI endoscopy Indications:              Dysphagia Medicines:                Propofol per Anesthesia, Monitored Anesthesia Care Procedure:                Pre-Anesthesia Assessment:                           - Prior to the procedure, a History and Physical                            was performed, and patient medications and                            allergies were reviewed. The patient's tolerance of                            previous anesthesia was also reviewed. The risks                            and benefits of the procedure and the sedation                            options and risks were discussed with the patient.                            All questions were answered, and informed consent                            was obtained. Prior Anticoagulants: The patient has                            taken no previous anticoagulant or antiplatelet                            agents. ASA Grade Assessment: II - A patient with                            mild systemic disease. After reviewing the risks                            and benefits, the patient was deemed in                            satisfactory condition to undergo the procedure.                           After obtaining informed consent, the endoscope was  passed under direct vision. Throughout the                            procedure, the patient's blood pressure, pulse, and                            oxygen saturations were monitored continuously. The                            Endoscope was introduced through the mouth, and                            advanced to the second part of duodenum. The upper                            GI endoscopy was  accomplished without difficulty.                            The patient tolerated the procedure well. Scope In: Scope Out: Findings:                 The esophagus was normal.                           The stomach was normal.                           The examined duodenum was normal.                           The scope was withdrawn. Dilation was performed in                            the entire esophagus with a Maloney dilator with                            mild resistance at 54 Fr. Estimated blood loss:                            none. Complications:            No immediate complications. Estimated Blood Loss:     Estimated blood loss: none. Impression:               - Normal esophagus.                           - Normal stomach.                           - Normal examined duodenum.                           - Dilation performed in the entire esophagus.                           - No specimens collected. Recommendation:           -  Patient has a contact number available for                            emergencies. The signs and symptoms of potential                            delayed complications were discussed with the                            patient. Return to normal activities tomorrow.                            Written discharge instructions were provided to the                            patient.                           - Clear liquids x 1 hour then soft foods rest of                            day. Start prior diet tomorrow.                           - Continue present medications.                           - Continue present medications.                           - If dilation does not help dysphagia consider                            motility evaluation. She has dysphagia and some sxs                            of spasm.                           - See the other procedure note for documentation of                            additional recommendations. Gatha Mayer, MD 11/10/2017 8:23:47 AM This report has been signed electronically.

## 2017-11-11 ENCOUNTER — Telehealth: Payer: Self-pay | Admitting: *Deleted

## 2017-11-11 NOTE — Telephone Encounter (Signed)
  Follow up Call-  Call back number 11/10/2017  Post procedure Call Back phone  # [phone]  Permission to leave phone message No  Some recent data might be hidden     Patient questions:  Do you have a fever, pain , or abdominal swelling? No. Pain Score  0 *  Have you tolerated food without any problems? Yes.    Have you been able to return to your normal activities? Yes.    Do you have any questions about your discharge instructions: Diet   No. Medications  No. Follow up visit  No.  Do you have questions or concerns about your Care? No.  Actions: * If pain score is 4 or above: No action needed, pain <4.

## 2017-11-29 ENCOUNTER — Encounter: Payer: Self-pay | Admitting: Family Medicine

## 2017-11-29 DIAGNOSIS — E039 Hypothyroidism, unspecified: Secondary | ICD-10-CM | POA: Insufficient documentation

## 2017-11-29 DIAGNOSIS — E785 Hyperlipidemia, unspecified: Secondary | ICD-10-CM | POA: Insufficient documentation

## 2017-11-29 DIAGNOSIS — M8580 Other specified disorders of bone density and structure, unspecified site: Secondary | ICD-10-CM | POA: Insufficient documentation

## 2017-11-29 DIAGNOSIS — E559 Vitamin D deficiency, unspecified: Secondary | ICD-10-CM | POA: Insufficient documentation

## 2017-11-29 DIAGNOSIS — E038 Other specified hypothyroidism: Secondary | ICD-10-CM | POA: Insufficient documentation

## 2017-11-29 DIAGNOSIS — I10 Essential (primary) hypertension: Secondary | ICD-10-CM | POA: Insufficient documentation

## 2017-11-29 MED ORDER — PRAVASTATIN SODIUM 40 MG PO TABS: 40.0000 mg | ORAL_TABLET | Freq: Every day | ORAL | 3 refills | Status: DC

## 2017-11-29 NOTE — Addendum Note (Signed)
Addended by: [name] on: 11/29/2017 11:40 AM   Modules accepted: Orders

## 2017-12-05 NOTE — Addendum Note (Signed)
Addended by: [name] on: 12/05/2017 02:02 PM   Modules accepted: Level of Service

## 2018-04-01 ENCOUNTER — Other Ambulatory Visit: Payer: Self-pay | Admitting: Family Medicine

## 2018-05-05 ENCOUNTER — Encounter: Payer: Self-pay | Admitting: Family Medicine

## 2018-05-05 ENCOUNTER — Ambulatory Visit (INDEPENDENT_AMBULATORY_CARE_PROVIDER_SITE_OTHER): Payer: Medicare Other | Admitting: Family Medicine

## 2018-05-05 VITALS — BP 120/72 | HR 75 | Temp 98.2°F | Resp 17 | Ht 65.0 in | Wt 138.0 lb

## 2018-05-05 DIAGNOSIS — Z8639 Personal history of other endocrine, nutritional and metabolic disease: Secondary | ICD-10-CM | POA: Diagnosis not present

## 2018-05-05 DIAGNOSIS — E010 Iodine-deficiency related diffuse (endemic) goiter: Secondary | ICD-10-CM | POA: Diagnosis not present

## 2018-05-05 DIAGNOSIS — M8589 Other specified disorders of bone density and structure, multiple sites: Secondary | ICD-10-CM | POA: Diagnosis not present

## 2018-05-05 DIAGNOSIS — E559 Vitamin D deficiency, unspecified: Secondary | ICD-10-CM

## 2018-05-05 DIAGNOSIS — I10 Essential (primary) hypertension: Secondary | ICD-10-CM

## 2018-05-05 DIAGNOSIS — E782 Mixed hyperlipidemia: Secondary | ICD-10-CM | POA: Diagnosis not present

## 2018-05-05 DIAGNOSIS — E039 Hypothyroidism, unspecified: Secondary | ICD-10-CM

## 2018-05-05 DIAGNOSIS — E038 Other specified hypothyroidism: Secondary | ICD-10-CM

## 2018-05-05 LAB — POCT URINALYSIS DIP (MANUAL ENTRY)
Bilirubin, UA: NEGATIVE
Blood, UA: NEGATIVE
GLUCOSE UA: NEGATIVE mg/dL
Ketones, POC UA: NEGATIVE mg/dL
Leukocytes, UA: NEGATIVE
NITRITE UA: NEGATIVE
PROTEIN UA: NEGATIVE mg/dL
Spec Grav, UA: 1.025 (ref 1.010–1.025)
UROBILINOGEN UA: 0.2 U/dL
pH, UA: 5.5 (ref 5.0–8.0)

## 2018-05-05 MED ORDER — HYDROCHLOROTHIAZIDE 25 MG PO TABS: 25.0000 mg | ORAL_TABLET | Freq: Every day | ORAL | 3 refills | Status: DC

## 2018-05-05 NOTE — Progress Notes (Addendum)
Subjective:  By signing my name below, I, Erin Gay, attest that this documentation has been prepared under the direction and in the presence of [name], MD. Electronically Signed: Moises Gay, [place]. 05/05/2018 , 10:48 AM .  Patient was seen in Room 3 .   Patient ID: Erin Gay, female    DOB: March 27, 1946, 72 y.o.   MRN: [id number] Chief Complaint  Patient presents with  . Medication Refill    HCTZ   HPI Erin Gay is a 72 y.o. female who presents to [place] [facility] for medication refill. She is fasting today.   She reports she isn't taking pravastatin after the initial prescription, as she doesn't want to take a statin. She takes Vitamin D 2000 and calcium 600 BID. She denies complications with HCTZ; no muscle cramps, polydipsia, urinary frequency or urinary incontinence. She hasn't had regular exercise recently; plans to clean her treadmill to bring into her house. She's bowling once a week.   She also mentions right upper leg pain, where she describes sensation of something had "exploded inside." She notes the affected area has gotten bigger and worsening aches as it feels very tender. About 1 year prior in March 2018, patient had report right lateral thigh itching at night and change in sensation, "like something is there" intermittently for the past 8-10 years.   Past Medical History:  Diagnosis Date  . Thyroid disease    Past Surgical History:  Procedure Laterality Date  . BREAST SURGERY    . COSMETIC SURGERY    . EYE SURGERY    . 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 TONSILLECTOMY  72 years old   Prior to Admission medications   Medication Sig Start Date End Date Taking? Authorizing Provider  Calcium Carbonate (CALCIUM 600 PO) Take 1 tablet 2 (two) times daily by mouth.    [provider]    Cholecalciferol (VITAMIN D3 PO) Take 1 tablet daily by mouth.    [provider]  hydrochlorothiazide (HYDRODIURIL) 25 MG tablet Take 1 tablet (25 mg total) by mouth daily. Patient needs office visit for more refills 04/01/18   [name], MD  Multiple Vitamin (MULTIVITAMIN) capsule Take 1 capsule daily by mouth.    [provider]  pravastatin (PRAVACHOL) 40 MG tablet Take 1 tablet (40 mg total) by mouth daily. 11/29/17   [name], MD   No Known Allergies Family History  Problem Relation Age of Onset  . Cancer Mother        pt unsure, colon resection done  . Heart disease Mother   . Hyperlipidemia Mother   . Colon polyps Mother   . Heart disease Father   . Heart disease Brother   . Diabetes Brother    Social History   Socioeconomic History  . Marital status: Single    Spouse name: Not on file  . Number of children: Not on file  . Years of education: Not on file  . Highest education level: Not on file  Occupational History  . Not on file  Social Needs  . Financial resource strain: Not on file  . Food insecurity:    Worry: Not on file    Inability: Not on file  . Transportation needs:    Medical: Not on file  Non-medical: Not on file  Tobacco Use  . Smoking status: Never Smoker  . Smokeless tobacco: Never Used  Substance and Sexual Activity  . Alcohol use: No    Alcohol/week: 0.0 oz  . Drug use: No  . Sexual activity: Not on file  Lifestyle  . Physical activity:    Days per week: Not on file    Minutes per session: Not on file  . Stress: Not on file  Relationships  . Social connections:    Talks on phone: Not on file    Gets together: Not on file    Attends religious service: Not on file    Active member of club or organization: Not on file    Attends meetings of clubs or organizations: Not on file    Relationship status: Not on file  Other Topics Concern  . Not on file  Social History Narrative  . Not on file   Depression screen  [facility] 2/9 05/05/2018 09/18/2017 09/08/2017 03/17/2017 02/17/2017  Decreased Interest 0 0 0 0 0  Down, Depressed, Hopeless 0 0 0 0 0  PHQ - 2 Score 0 0 0 0 0    Review of Systems  Constitutional: Negative for chills, fatigue, fever and unexpected weight change.  Respiratory: Negative for cough.   Gastrointestinal: Negative for constipation, diarrhea, nausea and vomiting.  Skin: Negative for rash and wound.  Neurological: Negative for dizziness, weakness and headaches.       Objective:   Physical Exam  Constitutional: She is oriented to person, place, and time. She appears well-developed and well-nourished. No distress.  HENT:  Head: Normocephalic and atraumatic.  Eyes: Pupils are equal, round, and reactive to light. EOM are normal.  Neck: Neck supple. Thyromegaly (slightly enlarged) present.  Cardiovascular: Normal rate, regular rhythm, S1 normal, S2 normal and normal heart sounds.  Pulmonary/Chest: Effort normal and breath sounds normal. No respiratory distress. She has no decreased breath sounds.  Musculoskeletal: Normal range of motion.  Lymphadenopathy:    She has cervical adenopathy (anterior).  Neurological: She is alert and oriented to person, place, and time.  Skin: Skin is warm and dry.  Psychiatric: She has a normal mood and affect. Her behavior is normal.  Nursing note and vitals reviewed.   BP 120/72   Pulse 75   Temp 98.2 F (36.8 C) (Oral)   Resp 17   Ht 5\' 5"  (1.651 m)   Wt 138 lb (62.6 kg)   SpO2 98%   BMI 22.96 kg/m      Assessment & Plan:   1. Subclinical hypothyroidism - labs today now normal and will check thyroid US (ADDENDUM: nml)  2. Essential hypertension - well controlled on hctz  3. Osteopenia of multiple sites   4. Mixed hyperlipidemia - did well on pravastatin but pt stopped as she just didn't want to take one.   5. Vitamin D deficiency   6. Thyromegaly - check thyroid US (ADDENDUM: [place] nml heterogeneous tissue)  7. History of thyroid nodule      Orders Placed This Encounter  Procedures  . US THYROID    Epic ORDER WT 138/NO NEEDS INS MCR MOO SB W/PT    Standing Status:   Future    Number of Occurrences:   1    Standing Expiration Date:   07/06/2019    Order Specific Question:   Reason for Exam (SYMPTOM  OR DIAGNOSIS REQUIRED)    Answer:   enlarged thyroid with surrounding adenopathy, h/o thyroid nodule requiring  surgical removal, subclinical hypothyroidism    Order Specific Question:   Preferred imaging location?    Answer:   [facility]  . TSH+T4F+T3Free  . Lipid panel    Order Specific Question:   Has the patient fasted?    Answer:   Yes  . Comprehensive metabolic panel    Order Specific Question:   Has the patient fasted?    Answer:   Yes  . VITAMIN D 25 Hydroxy (Vit-D Deficiency, Fractures)  . High sensitivity CRP  . High sensitivity CRP  . POCT urinalysis dipstick    Meds ordered this encounter  Medications  . hydrochlorothiazide (HYDRODIURIL) 25 MG tablet    Sig: Take 1 tablet (25 mg total) by mouth daily.    Dispense:  90 tablet    Refill:  3    Patient due appointment    I personally performed the services described in this documentation, which was scribed in my presence. The recorded information has been reviewed and considered, and addended by me as needed.   [name], M.D.  Primary Care at [facility] [address] [place],  [address] [phone] phone [phone] fax  09/03/18 12:44 PM

## 2018-05-05 NOTE — Patient Instructions (Signed)
     IF you received an x-ray today, you will receive an invoice from [facility]. Please contact [facility] at [phone] with questions or concerns regarding your invoice.   IF you received labwork today, you will receive an invoice from [facility]. Please contact [facility] at [phone] with questions or concerns regarding your invoice.   Our billing staff will not be able to assist you with questions regarding bills from these companies.  You will be contacted with the lab results as soon as they are available. The fastest way to get your results is to activate your My Chart account. Instructions are located on the last page of this paperwork. If you have not heard from us regarding the results in 2 weeks, please contact this office.     

## 2018-05-06 LAB — COMPREHENSIVE METABOLIC PANEL
A/G RATIO: 1.6 (ref 1.2–2.2)
ALBUMIN: 4.3 g/dL (ref 3.5–4.8)
ALT: 17 IU/L (ref 0–32)
AST: 19 IU/L (ref 0–40)
Alkaline Phosphatase: 52 IU/L (ref 39–117)
BUN / CREAT RATIO: 25 (ref 12–28)
BUN: 20 mg/dL (ref 8–27)
Bilirubin Total: 0.6 mg/dL (ref 0.0–1.2)
CALCIUM: 10.2 mg/dL (ref 8.7–10.3)
CO2: 24 mmol/L (ref 20–29)
CREATININE: 0.8 mg/dL (ref 0.57–1.00)
Chloride: 103 mmol/L (ref 96–106)
GFR calc Af Amer: 86 mL/min/{1.73_m2} (ref >59)
GFR, EST NON AFRICAN AMERICAN: 74 mL/min/{1.73_m2} (ref >59)
Globulin, Total: 2.7 g/dL (ref 1.5–4.5)
Glucose: 88 mg/dL (ref 65–99)
POTASSIUM: 3.6 mmol/L (ref 3.5–5.2)
SODIUM: 142 mmol/L (ref 134–144)
Total Protein: 7 g/dL (ref 6.0–8.5)

## 2018-05-06 LAB — LIPID PANEL
CHOLESTEROL TOTAL: 293 mg/dL — AB (ref 100–199)
Chol/HDL Ratio: 3.9 ratio (ref 0.0–4.4)
HDL: 76 mg/dL (ref >39)
LDL Calculated: 196 mg/dL — ABNORMAL HIGH (ref 0–99)
Triglycerides: 107 mg/dL (ref 0–149)
VLDL Cholesterol Cal: 21 mg/dL (ref 5–40)

## 2018-05-06 LAB — VITAMIN D 25 HYDROXY (VIT D DEFICIENCY, FRACTURES): Vit D, 25-Hydroxy: 46.7 ng/mL (ref 30.0–100.0)

## 2018-05-06 LAB — TSH+T4F+T3FREE
Free T4: 0.95 ng/dL (ref 0.82–1.77)
T3 FREE: 2.7 pg/mL (ref 2.0–4.4)
TSH: 4.3 u[IU]/mL (ref 0.450–4.500)

## 2018-05-06 LAB — HIGH SENSITIVITY CRP: CRP, High Sensitivity: 0.8 mg/L (ref 0.00–3.00)

## 2018-05-22 ENCOUNTER — Ambulatory Visit
Admission: RE | Admit: 2018-05-22 | Discharge: 2018-05-22 | Disposition: A | Discharge status: Home / Self Care | Payer: Medicare Other | Source: Ambulatory Visit | Attending: Family Medicine | Admitting: Family Medicine

## 2018-05-22 DIAGNOSIS — Z8639 Personal history of other endocrine, nutritional and metabolic disease: Secondary | ICD-10-CM

## 2018-05-22 DIAGNOSIS — E010 Iodine-deficiency related diffuse (endemic) goiter: Secondary | ICD-10-CM | POA: Diagnosis not present

## 2018-05-22 DIAGNOSIS — E039 Hypothyroidism, unspecified: Secondary | ICD-10-CM

## 2018-05-22 DIAGNOSIS — E038 Other specified hypothyroidism: Secondary | ICD-10-CM

## 2018-09-07 IMAGING — DX DG HIP (WITH OR WITHOUT PELVIS) 2-3V*L*
3 series · 3 of 3 positions shown · non-contrast
Comparison: None.

CLINICAL DATA: Left ischial tuberosity pain, no known injury

EXAM:
DG HIP (WITH OR WITHOUT PELVIS) 2-3V LEFT

[pelvis ap]
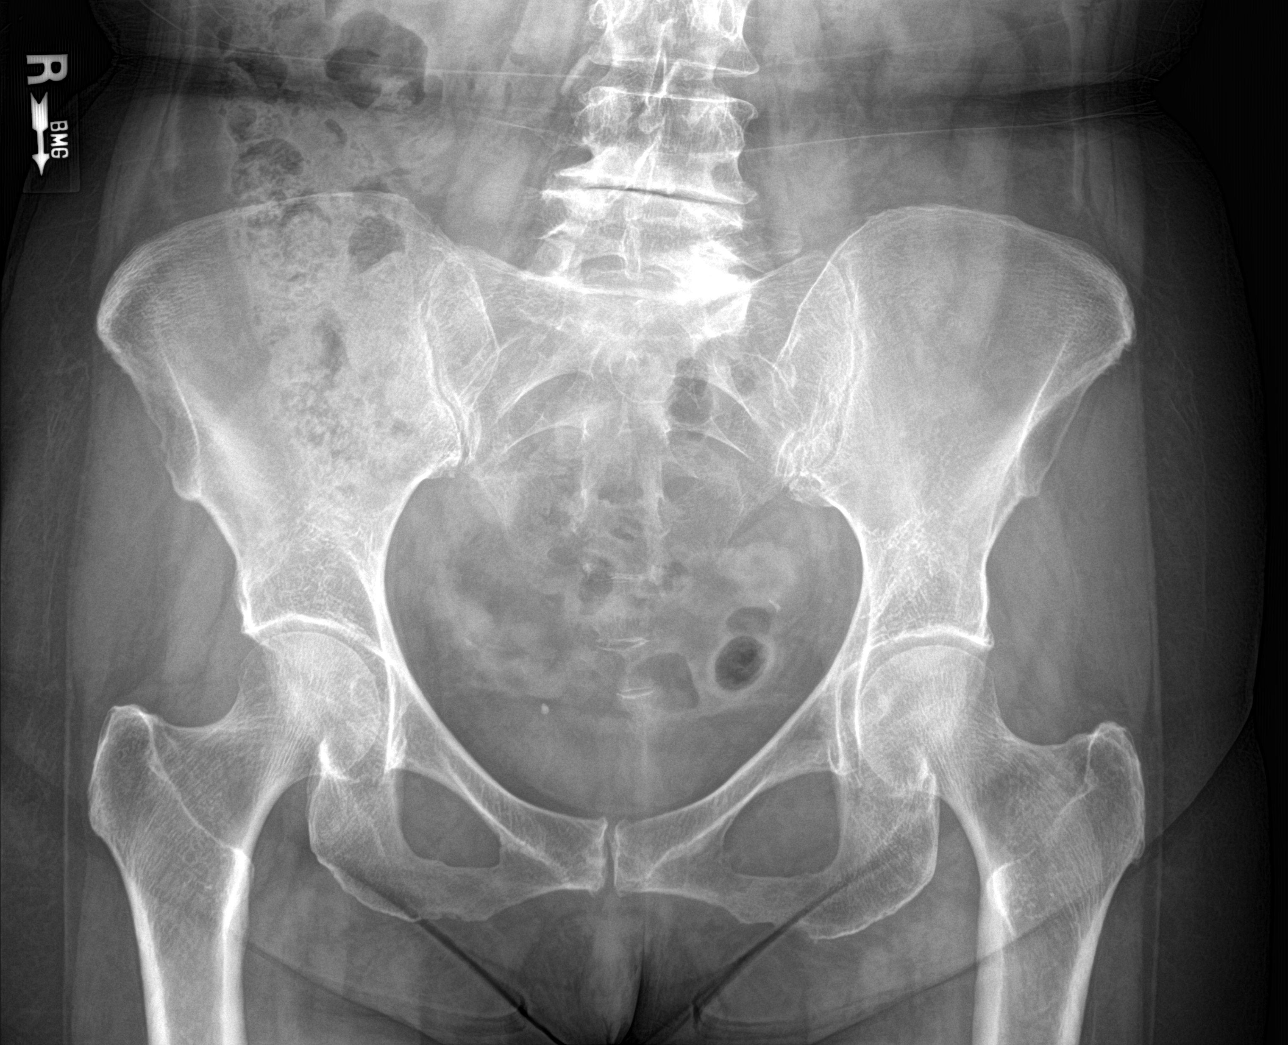

[hip ap]
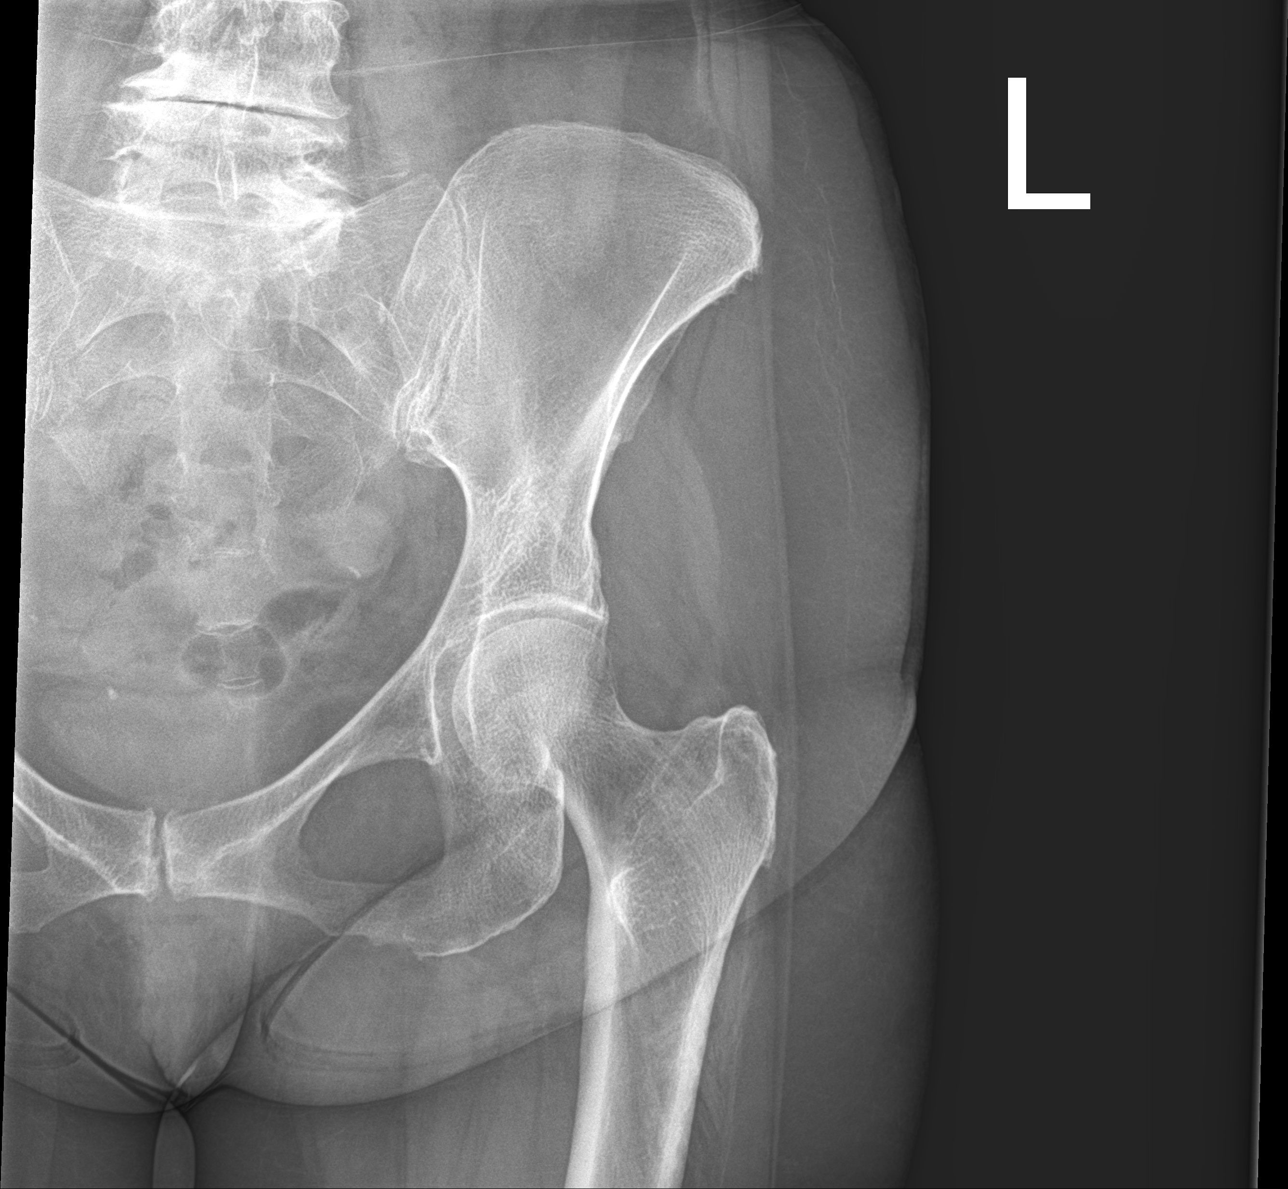

[hip lat]
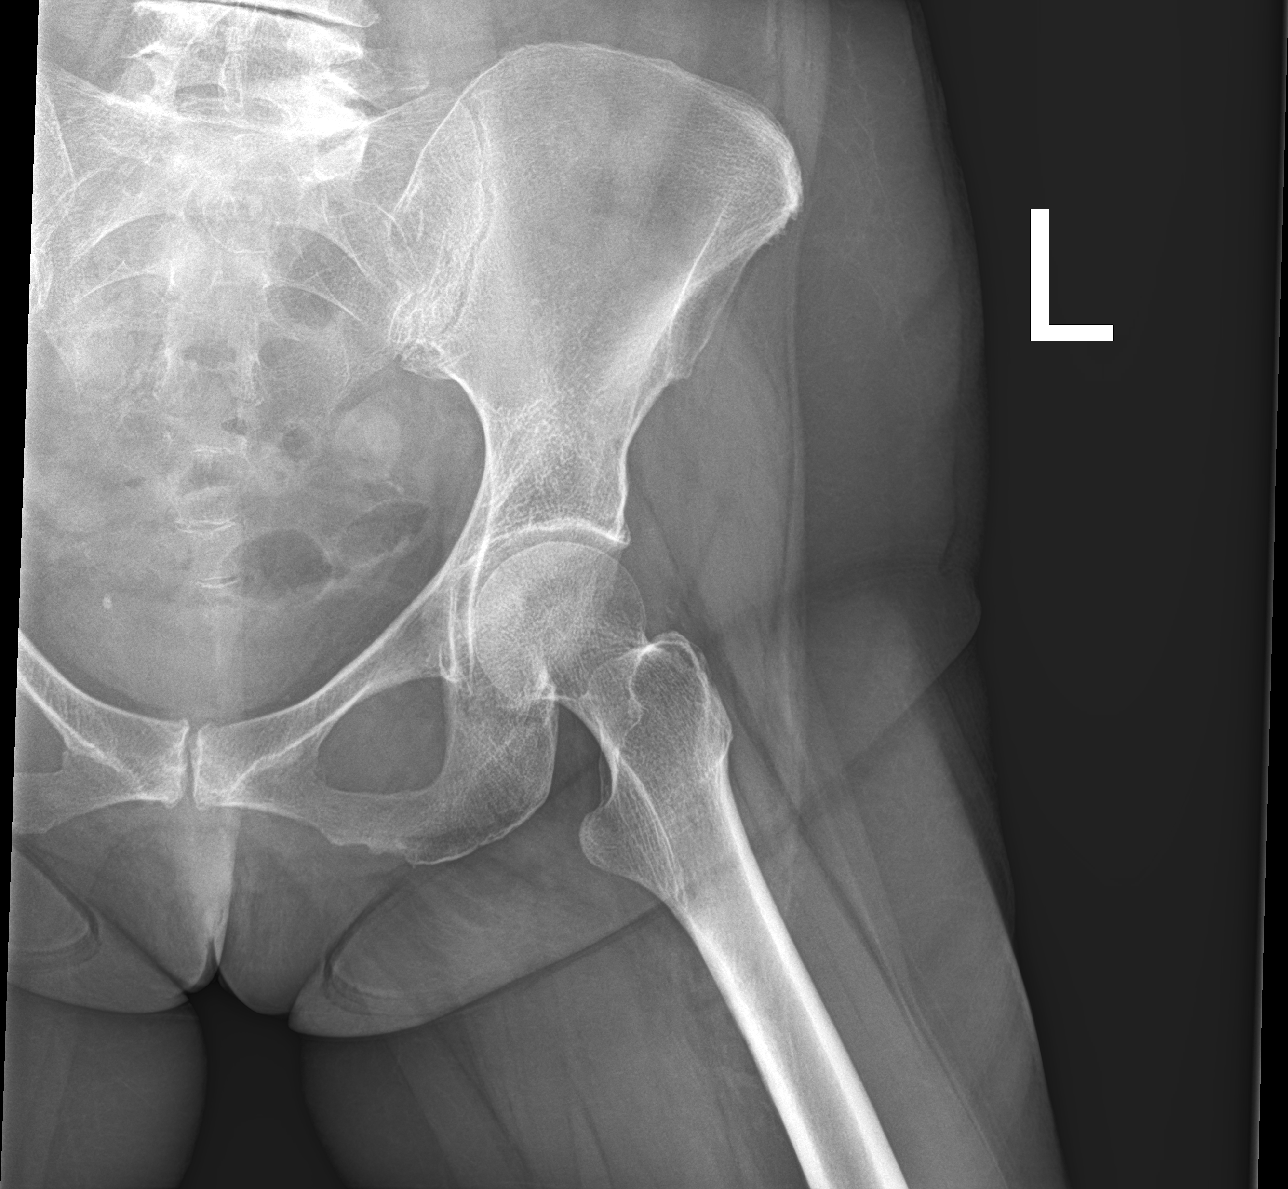

[3 of 3 positions shown; findings below may reference images not displayed]

FINDINGS: Three views of the left hip submitted. No acute fracture or
subluxation. Bilateral hip joints are symmetrical in appearance.
There are degenerative changes pubic symphysis.
IMPRESSION: No acute fracture or subluxation. Degenerative changes pubic
symphysis.

## 2018-11-09 DIAGNOSIS — Z23 Encounter for immunization: Secondary | ICD-10-CM | POA: Diagnosis not present

## 2019-05-18 ENCOUNTER — Telehealth (INDEPENDENT_AMBULATORY_CARE_PROVIDER_SITE_OTHER): Payer: Medicare Other | Admitting: Registered Nurse

## 2019-05-18 ENCOUNTER — Other Ambulatory Visit: Payer: Self-pay

## 2019-05-18 ENCOUNTER — Encounter: Payer: Self-pay | Admitting: Registered Nurse

## 2019-05-18 DIAGNOSIS — E782 Mixed hyperlipidemia: Secondary | ICD-10-CM

## 2019-05-18 DIAGNOSIS — R5383 Other fatigue: Secondary | ICD-10-CM

## 2019-05-18 DIAGNOSIS — M899 Disorder of bone, unspecified: Secondary | ICD-10-CM

## 2019-05-18 DIAGNOSIS — E038 Other specified hypothyroidism: Secondary | ICD-10-CM

## 2019-05-18 DIAGNOSIS — E039 Hypothyroidism, unspecified: Secondary | ICD-10-CM

## 2019-05-18 DIAGNOSIS — I10 Essential (primary) hypertension: Secondary | ICD-10-CM

## 2019-05-18 MED ORDER — HYDROCHLOROTHIAZIDE 25 MG PO TABS: 25.0000 mg | ORAL_TABLET | Freq: Every day | ORAL | 0 refills | Status: DC

## 2019-05-18 NOTE — Patient Instructions (Signed)
Hypertension Hypertension, commonly called high blood pressure, is when the force of blood pumping through the arteries is too strong. The arteries are the blood vessels that carry blood from the heart throughout the body. Hypertension forces the heart to work harder to pump blood and may cause arteries to become narrow or stiff. Having untreated or uncontrolled hypertension can cause heart attacks, strokes, kidney disease, and other problems. A blood pressure reading consists of a higher number over a lower number. Ideally, your blood pressure should be below 120/80. The first ("top") number is called the systolic pressure. It is a measure of the pressure in your arteries as your heart beats. The second ("bottom") number is called the diastolic pressure. It is a measure of the pressure in your arteries as the heart relaxes. What are the causes? The cause of this condition is not known. What increases the risk? Some risk factors for high blood pressure are under your control. Others are not. Factors you can change  Smoking.  Having type 2 diabetes mellitus, high cholesterol, or both.  Not getting enough exercise or physical activity.  Being overweight.  Having too much fat, sugar, calories, or salt (sodium) in your diet.  Drinking too much alcohol. Factors that are difficult or impossible to change  Having chronic kidney disease.  Having a family history of high blood pressure.  Age. Risk increases with age.  Race. You may be at higher risk if you are African-American.  Gender. Men are at higher risk than women before age 27. After age 79, women are at higher risk than men.  Having obstructive sleep apnea.  Stress. What are the signs or symptoms? Extremely high blood pressure (hypertensive crisis) may cause:  Headache.  Anxiety.  Shortness of breath.  Nosebleed.  Nausea and vomiting.  Severe chest pain.  Jerky movements you cannot control (seizures). How is this  diagnosed? This condition is diagnosed by measuring your blood pressure while you are seated, with your arm resting on a surface. The cuff of the blood pressure monitor will be placed directly against the skin of your upper arm at the level of your heart. It should be measured at least twice using the same arm. Certain conditions can cause a difference in blood pressure between your right and left arms. Certain factors can cause blood pressure readings to be lower or higher than normal (elevated) for a short period of time:  When your blood pressure is higher when you are in a health care provider's office than when you are at home, this is called white coat hypertension. Most people with this condition do not need medicines.  When your blood pressure is higher at home than when you are in a health care provider's office, this is called masked hypertension. Most people with this condition may need medicines to control blood pressure. If you have a high blood pressure reading during one visit or you have normal blood pressure with other risk factors:  You may be asked to return on a different day to have your blood pressure checked again.  You may be asked to monitor your blood pressure at home for 1 week or longer. If you are diagnosed with hypertension, you may have other blood or imaging tests to help your health care provider understand your overall risk for other conditions. How is this treated? This condition is treated by making healthy lifestyle changes, such as eating healthy foods, exercising more, and reducing your alcohol intake. Your health care provider  may prescribe medicine if lifestyle changes are not enough to get your blood pressure under control, and if:  Your systolic blood pressure is above 130.  Your diastolic blood pressure is above 80. Your personal target blood pressure may vary depending on your medical conditions, your age, and other factors. Follow these instructions  at home: Eating and drinking   Eat a diet that is high in fiber and potassium, and low in sodium, added sugar, and fat. An example eating plan is called the DASH (Dietary Approaches to Stop Hypertension) diet. To eat this way: ? Eat plenty of fresh fruits and vegetables. Try to fill half of your plate at each meal with fruits and vegetables. ? Eat whole grains, such as whole wheat pasta, brown rice, or whole grain bread. Fill about one quarter of your plate with whole grains. ? Eat or drink low-fat dairy products, such as skim milk or low-fat yogurt. ? Avoid fatty cuts of meat, processed or cured meats, and poultry with skin. Fill about one quarter of your plate with lean proteins, such as fish, chicken without skin, beans, eggs, and tofu. ? Avoid premade and processed foods. These tend to be higher in sodium, added sugar, and fat.  Reduce your daily sodium intake. Most people with hypertension should eat less than 1,500 mg of sodium a day.  Limit alcohol intake to no more than 1 drink a day for nonpregnant women and 2 drinks a day for men. One drink equals 12 oz of beer, 5 oz of wine, or 1 oz of hard liquor. Lifestyle   Work with your health care provider to maintain a healthy body weight or to lose weight. Ask what an ideal weight is for you.  Get at least 30 minutes of exercise that causes your heart to beat faster (aerobic exercise) most days of the week. Activities may include walking, swimming, or biking.  Include exercise to strengthen your muscles (resistance exercise), such as pilates or lifting weights, as part of your weekly exercise routine. Try to do these types of exercises for 30 minutes at least 3 days a week.  Do not use any products that contain nicotine or tobacco, such as cigarettes and e-cigarettes. If you need help quitting, ask your health care provider.  Monitor your blood pressure at home as told by your health care provider.  Keep all follow-up visits as told by  your health care provider. This is important. Medicines  Take over-the-counter and prescription medicines only as told by your health care provider. Follow directions carefully. Blood pressure medicines must be taken as prescribed.  Do not skip doses of blood pressure medicine. Doing this puts you at risk for problems and can make the medicine less effective.  Ask your health care provider about side effects or reactions to medicines that you should watch for. Contact a health care provider if:  You think you are having a reaction to a medicine you are taking.  You have headaches that keep coming back (recurring).  You feel dizzy.  You have swelling in your ankles.  You have trouble with your vision. Get help right away if:  You develop a severe headache or confusion.  You have unusual weakness or numbness.  You feel faint.  You have severe pain in your chest or abdomen.  You vomit repeatedly.  You have trouble breathing. Summary  Hypertension is when the force of blood pumping through your arteries is too strong. If this condition is not controlled, it  may put you at risk for serious complications.  Your personal target blood pressure may vary depending on your medical conditions, your age, and other factors. For most people, a normal blood pressure is less than 120/80.  Hypertension is treated with lifestyle changes, medicines, or a combination of both. Lifestyle changes include weight loss, eating a healthy, low-sodium diet, exercising more, and limiting alcohol. This information is not intended to replace advice given to you by your health care provider. Make sure you discuss any questions you have with your health care provider. Document Released: 12/02/2005 Document Revised: 10/30/2016 Document Reviewed: 10/30/2016 Elsevier Interactive Patient Education  2019 [employer].  Managing Your Hypertension Hypertension is commonly called high blood pressure. This is when  the force of your blood pressing against the walls of your arteries is too strong. Arteries are blood vessels that carry blood from your heart throughout your body. Hypertension forces the heart to work harder to pump blood, and may cause the arteries to become narrow or stiff. Having untreated or uncontrolled hypertension can cause heart attack, stroke, kidney disease, and other problems. What are blood pressure readings? A blood pressure reading consists of a higher number over a lower number. Ideally, your blood pressure should be below 120/80. The first ("top") number is called the systolic pressure. It is a measure of the pressure in your arteries as your heart beats. The second ("bottom") number is called the diastolic pressure. It is a measure of the pressure in your arteries as the heart relaxes. What does my blood pressure reading mean? Blood pressure is classified into four stages. Based on your blood pressure reading, your health care provider may use the following stages to determine what type of treatment you need, if any. Systolic pressure and diastolic pressure are measured in a unit called mm Hg. Normal  Systolic pressure: below 259.  Diastolic pressure: below 80. Elevated  Systolic pressure: 563-875.  Diastolic pressure: below 80. Hypertension stage 1  Systolic pressure: 643-329.  Diastolic pressure: 51-88. Hypertension stage 2  Systolic pressure: 416 or above.  Diastolic pressure: 90 or above. What health risks are associated with hypertension? Managing your hypertension is an important responsibility. Uncontrolled hypertension can lead to:  A heart attack.  A stroke.  A weakened blood vessel (aneurysm).  Heart failure.  Kidney damage.  Eye damage.  Metabolic syndrome.  Memory and concentration problems. What changes can I make to manage my hypertension? Hypertension can be managed by making lifestyle changes and possibly by taking medicines. Your health  care provider will help you make a plan to bring your blood pressure within a normal range. Eating and drinking   Eat a diet that is high in fiber and potassium, and low in salt (sodium), added sugar, and fat. An example eating plan is called the DASH (Dietary Approaches to Stop Hypertension) diet. To eat this way: ? Eat plenty of fresh fruits and vegetables. Try to fill half of your plate at each meal with fruits and vegetables. ? Eat whole grains, such as whole wheat pasta, brown rice, or whole grain bread. Fill about one quarter of your plate with whole grains. ? Eat low-fat diary products. ? Avoid fatty cuts of meat, processed or cured meats, and poultry with skin. Fill about one quarter of your plate with lean proteins such as fish, chicken without skin, beans, eggs, and tofu. ? Avoid premade and processed foods. These tend to be higher in sodium, added sugar, and fat.  Reduce your daily  sodium intake. Most people with hypertension should eat less than 1,500 mg of sodium a day.  Limit alcohol intake to no more than 1 drink a day for nonpregnant women and 2 drinks a day for men. One drink equals 12 oz of beer, 5 oz of wine, or 1 oz of hard liquor. Lifestyle  Work with your health care provider to maintain a healthy body weight, or to lose weight. Ask what an ideal weight is for you.  Get at least 30 minutes of exercise that causes your heart to beat faster (aerobic exercise) most days of the week. Activities may include walking, swimming, or biking.  Include exercise to strengthen your muscles (resistance exercise), such as weight lifting, as part of your weekly exercise routine. Try to do these types of exercises for 30 minutes at least 3 days a week.  Do not use any products that contain nicotine or tobacco, such as cigarettes and e-cigarettes. If you need help quitting, ask your health care provider.  Control any long-term (chronic) conditions you have, such as high cholesterol or  diabetes. Monitoring  Monitor your blood pressure at home as told by your health care provider. Your personal target blood pressure may vary depending on your medical conditions, your age, and other factors.  Have your blood pressure checked regularly, as often as told by your health care provider. Working with your health care provider  Review all the medicines you take with your health care provider because there may be side effects or interactions.  Talk with your health care provider about your diet, exercise habits, and other lifestyle factors that may be contributing to hypertension.  Visit your health care provider regularly. Your health care provider can help you create and adjust your plan for managing hypertension. Will I need medicine to control my blood pressure? Your health care provider may prescribe medicine if lifestyle changes are not enough to get your blood pressure under control, and if:  Your systolic blood pressure is 130 or higher.  Your diastolic blood pressure is 80 or higher. Take medicines only as told by your health care provider. Follow the directions carefully. Blood pressure medicines must be taken as prescribed. The medicine does not work as well when you skip doses. Skipping doses also puts you at risk for problems. Contact a health care provider if:  You think you are having a reaction to medicines you have taken.  You have repeated (recurrent) headaches.  You feel dizzy.  You have swelling in your ankles.  You have trouble with your vision. Get help right away if:  You develop a severe headache or confusion.  You have unusual weakness or numbness, or you feel faint.  You have severe pain in your chest or abdomen.  You vomit repeatedly.  You have trouble breathing. Summary  Hypertension is when the force of blood pumping through your arteries is too strong. If this condition is not controlled, it may put you at risk for serious  complications.  Your personal target blood pressure may vary depending on your medical conditions, your age, and other factors. For most people, a normal blood pressure is less than 120/80.  Hypertension is managed by lifestyle changes, medicines, or both. Lifestyle changes include weight loss, eating a healthy, low-sodium diet, exercising more, and limiting alcohol. This information is not intended to replace advice given to you by your health care provider. Make sure you discuss any questions you have with your health care provider. Document Released: 08/26/2012  Document Revised: 10/30/2016 Document Reviewed: 10/30/2016 Elsevier Interactive Patient Education  2019 [employer]  Cholesterol Cholesterol is a white, waxy, fat-like substance that is needed by the human body in small amounts. The liver makes all the cholesterol we need. Cholesterol is carried from the liver by the blood through the blood vessels. Deposits of cholesterol (plaques) may build up on blood vessel (artery) walls. Plaques make the arteries narrower and stiffer. Cholesterol plaques increase the risk for heart attack and stroke. You cannot feel your cholesterol level even if it is very high. The only way to know that it is high is to have a blood test. Once you know your cholesterol levels, you should keep a record of the test results. Work with your health care provider to keep your levels in the desired range. What do the results mean?  Total cholesterol is a rough measure of all the cholesterol in your blood.  LDL (low-density lipoprotein) is the bad cholesterol. This is the type that causes plaque to build up on the artery walls. You want this level to be low.  HDL (high-density lipoprotein) is the good cholesterol because it cleans the arteries and carries the LDL away. You want this level to be high.  Triglycerides are fat that the body can either burn for energy or store. High levels are closely linked to heart  disease. What are the desired levels of cholesterol?  Total cholesterol below 200.  LDL below 100 for people who are at risk, below 70 for people at very high risk.  HDL above 40 is good. A level of 60 or higher is considered to be protective against heart disease.  Triglycerides below 150. How can I lower my cholesterol? Diet Follow your diet program as told by your health care provider.  Choose fish or white meat chicken and [place], roasted or baked. Limit fatty cuts of red meat, fried foods, and processed meats, such as sausage and lunch meats.  Eat lots of fresh fruits and vegetables.  Choose whole grains, beans, pasta, potatoes, and cereals.  Choose olive oil, corn oil, or canola oil, and use only small amounts.  Avoid butter, mayonnaise, shortening, or palm kernel oils.  Avoid foods with trans fats.  Drink skim or nonfat milk and eat low-fat or nonfat yogurt and cheeses. Avoid whole milk, cream, ice cream, egg yolks, and full-fat cheeses.  Healthier desserts include angel food cake, ginger snaps, animal crackers, hard candy, popsicles, and low-fat or nonfat frozen yogurt. Avoid pastries, cakes, pies, and cookies.  Exercise  Follow your exercise program as told by your health care provider. A regular program: ? Helps to decrease LDL and raise HDL. ? Helps with weight control.  Do things that increase your activity level, such as gardening, walking, and taking the stairs.  Ask your health care provider about ways that you can be more active in your daily life. Medicine  Take over-the-counter and prescription medicines only as told by your health care provider. ? Medicine may be prescribed by your health care provider to help lower cholesterol and decrease the risk for heart disease. This is usually done if diet and exercise have failed to bring down cholesterol levels. ? If you have several risk factors, you may need medicine even if your levels are normal. This  information is not intended to replace advice given to you by your health care provider. Make sure you discuss any questions you have with your health care provider. Document Released: 08/27/2001 Document Revised:  06/29/2016 Document Reviewed: 06/01/2016 Elsevier Interactive Patient Education  2019 [employer].  Fat and Cholesterol Restricted Eating Plan Eating a diet that limits fat and cholesterol may help lower your risk for heart disease and other conditions. Your body needs fat and cholesterol for basic functions, but eating too much of these things can be harmful to your health. Your health care provider may order lab tests to check your blood fat (lipid) and cholesterol levels. This helps your health care provider understand your risk for certain conditions and whether you need to make diet changes. Work with your health care provider or dietitian to make an eating plan that is right for you. Your plan includes:  Limit your fat intake to ______% or less of your total calories a day.  Limit your saturated fat intake to ______% or less of your total calories a day.  Limit the amount of cholesterol in your diet to less than _________mg a day.  Eat ___________ g of fiber a day. What are tips for following this plan? General guidelines   If you are overweight, work with your health care provider to lose weight safely. Losing just 5-10% of your body weight can improve your overall health and help prevent diseases such as diabetes and heart disease.  Avoid: ? Foods with added sugar. ? Fried foods. ? Foods that contain partially hydrogenated oils, including stick margarine, some tub margarines, cookies, crackers, and other baked goods.  Limit alcohol intake to no more than 1 drink a day for nonpregnant women and 2 drinks a day for men. One drink equals 12 oz of beer, 5 oz of wine, or 1 oz of hard liquor. Reading food labels  Check food labels for: ? Trans fats, partially hydrogenated  oils, or high amounts of saturated fat. Avoid foods that contain saturated fat and trans fat. ? The amount of cholesterol in each serving. Try to eat no more than 200 mg of cholesterol each day. ? The amount of fiber in each serving. Try to eat at least 20-30 g of fiber each day.  Choose foods with healthy fats, such as: ? Monounsaturated and polyunsaturated fats. These include olive and canola oil, flaxseeds, walnuts, almonds, and seeds. ? Omega-3 fats. These are found in foods such as salmon, mackerel, sardines, tuna, flaxseed oil, and ground flaxseeds.  Choose grain products that have whole grains. Look for the word "whole" as the first word in the ingredient list. Cooking  Cook foods using methods other than frying. Baking, boiling, grilling, and broiling are some healthy options.  Eat more home-cooked food and less restaurant, buffet, and fast food.  Avoid cooking using saturated fats. ? Animal sources of saturated fats include meats, butter, and cream. ? Plant sources of saturated fats include palm oil, palm kernel oil, and coconut oil. Meal planning   At meals, imagine dividing your plate into fourths: ? Fill one-half of your plate with vegetables and green salads. ? Fill one-fourth of your plate with whole grains. ? Fill one-fourth of your plate with lean protein foods.  Eat fish that is high in omega-3 fats at least two times a week.  Eat more foods that contain fiber, such as whole grains, beans, apples, broccoli, carrots, peas, and barley. These foods help promote healthy cholesterol levels in the blood. Recommended foods Grains  Whole grains, such as whole wheat or whole grain breads, crackers, cereals, and pasta. Unsweetened oatmeal, bulgur, barley, quinoa, or brown rice. Corn or whole wheat flour tortillas. Vegetables  Fresh or frozen vegetables (raw, steamed, roasted, or grilled). Green salads. Fruits  All fresh, canned (in natural juice), or frozen fruits. Meats  and other protein foods  Ground beef (85% or leaner), grass-fed beef, or beef trimmed of fat. Skinless chicken or [place]. Ground chicken or [place]. Pork trimmed of fat. All fish and seafood. Egg whites. Dried beans, peas, or lentils. Unsalted nuts or seeds. Unsalted canned beans. Natural nut butters without added sugar and oil. Dairy  Low-fat or nonfat dairy products, such as skim or 1% milk, 2% or reduced-fat cheeses, low-fat and fat-free ricotta or cottage cheese, or plain low-fat and nonfat yogurt. Fats and oils  Tub margarine without trans fats. Light or reduced-fat mayonnaise and salad dressings. Avocado. Olive, canola, sesame, or safflower oils. The items listed above may not be a complete list of recommended foods or beverages. Contact your dietitian for more options. Foods to avoid Grains  White bread. White pasta. White rice. Cornbread. Bagels, pastries, and croissants. Crackers and snack foods that contain trans fat and hydrogenated oils. Vegetables  Vegetables cooked in cheese, cream, or butter sauce. Fried vegetables. Fruits  Canned fruit in heavy syrup. Fruit in cream or butter sauce. Fried fruit. Meats and other protein foods  Fatty cuts of meat. Ribs, chicken wings, bacon, sausage, bologna, salami, chitterlings, fatback, hot dogs, bratwurst, and packaged lunch meats. Liver and organ meats. Whole eggs and egg yolks. Chicken and [place] with skin. Fried meat. Dairy  Whole or 2% milk, cream, half-and-half, and cream cheese. Whole milk cheeses. Whole-fat or sweetened yogurt. Full-fat cheeses. Nondairy creamers and whipped toppings. Processed cheese, cheese spreads, and cheese curds. Beverages  Alcohol. Sugar-sweetened drinks such as sodas, lemonade, and fruit drinks. Fats and oils  Butter, stick margarine, lard, shortening, ghee, or bacon fat. Coconut, palm kernel, and palm oils. Sweets and desserts  Corn syrup, sugars, honey, and molasses. Candy. Jam and jelly. Syrup.  Sweetened cereals. Cookies, pies, cakes, donuts, muffins, and ice cream. The items listed above may not be a complete list of foods and beverages to avoid. Contact your dietitian for more information. Summary  Your body needs fat and cholesterol for basic functions. However, eating too much of these things can be harmful to your health.  Work with your health care provider and dietitian to follow a diet low in fat and cholesterol. Doing this may help lower your risk for heart disease and other conditions.  Choose healthy fats, such as monounsaturated and polyunsaturated fats, and foods high in omega-3 fatty acids.  Eat fiber-rich foods, such as whole grains, beans, peas, fruits, and vegetables.  Limit or avoid alcohol, fried foods, and foods high in saturated fats, partially hydrogenated oils, and sugar. This information is not intended to replace advice given to you by your health care provider. Make sure you discuss any questions you have with your health care provider. Document Released: 12/02/2005 Document Revised: 08/19/2017 Document Reviewed: 08/19/2017 Elsevier Interactive Patient Education  2019 [place] DASH stands for "Dietary Approaches to Stop Hypertension." The DASH eating plan is a healthy eating plan that has been shown to reduce high blood pressure (hypertension). It may also reduce your risk for type 2 diabetes, heart disease, and stroke. The DASH eating plan may also help with weight loss. What are tips for following this plan?  General guidelines  Avoid eating more than 2,300 mg (milligrams) of salt (sodium) a day. If you have hypertension, you may need to reduce your sodium intake to  1,500 mg a day.  Limit alcohol intake to no more than 1 drink a day for nonpregnant women and 2 drinks a day for men. One drink equals 12 oz of beer, 5 oz of wine, or 1 oz of hard liquor.  Work with your health care provider to maintain a healthy body weight or to  lose weight. Ask what an ideal weight is for you.  Get at least 30 minutes of exercise that causes your heart to beat faster (aerobic exercise) most days of the week. Activities may include walking, swimming, or biking.  Work with your health care provider or diet and nutrition specialist (dietitian) to adjust your eating plan to your individual calorie needs. Reading food labels   Check food labels for the amount of sodium per serving. Choose foods with less than 5 percent of the Daily Value of sodium. Generally, foods with less than 300 mg of sodium per serving fit into this eating plan.  To find whole grains, look for the word "whole" as the first word in the ingredient list. Shopping  Buy products labeled as "low-sodium" or "no salt added."  Buy fresh foods. Avoid canned foods and premade or frozen meals. Cooking  Avoid adding salt when cooking. Use salt-free seasonings or herbs instead of table salt or sea salt. Check with your health care provider or pharmacist before using salt substitutes.  Do not fry foods. Cook foods using healthy methods such as baking, boiling, grilling, and broiling instead.  Cook with heart-healthy oils, such as olive, canola, soybean, or sunflower oil. Meal planning  Eat a balanced diet that includes: ? 5 or more servings of fruits and vegetables each day. At each meal, try to fill half of your plate with fruits and vegetables. ? Up to 6-8 servings of whole grains each day. ? Less than 6 oz of lean meat, poultry, or fish each day. A 3-oz serving of meat is about the same size as a deck of cards. One egg equals 1 oz. ? 2 servings of low-fat dairy each day. ? A serving of nuts, seeds, or beans 5 times each week. ? Heart-healthy fats. Healthy fats called Omega-3 fatty acids are found in foods such as flaxseeds and coldwater fish, like sardines, salmon, and mackerel.  Limit how much you eat of the following: ? Canned or prepackaged foods. ? Food that is  high in trans fat, such as fried foods. ? Food that is high in saturated fat, such as fatty meat. ? Sweets, desserts, sugary drinks, and other foods with added sugar. ? Full-fat dairy products.  Do not salt foods before eating.  Try to eat at least 2 vegetarian meals each week.  Eat more home-cooked food and less restaurant, buffet, and fast food.  When eating at a restaurant, ask that your food be prepared with less salt or no salt, if possible. What foods are recommended? The items listed may not be a complete list. Talk with your dietitian about what dietary choices are best for you. Grains Whole-grain or whole-wheat bread. Whole-grain or whole-wheat pasta. Brown rice. [place] Nica Friske. Bulgur. Whole-grain and low-sodium cereals. Pita bread. Low-fat, low-sodium crackers. Whole-wheat flour tortillas. Vegetables Fresh or frozen vegetables (raw, steamed, roasted, or grilled). Low-sodium or reduced-sodium tomato and vegetable juice. Low-sodium or reduced-sodium tomato sauce and tomato paste. Low-sodium or reduced-sodium canned vegetables. Fruits All fresh, dried, or frozen fruit. Canned fruit in natural juice (without added sugar). Meat and other protein foods Skinless chicken or [place]. Ground chicken  or [place]. Pork with fat trimmed off. Fish and seafood. Egg whites. Dried beans, peas, or lentils. Unsalted nuts, nut butters, and seeds. Unsalted canned beans. Lean cuts of beef with fat trimmed off. Low-sodium, lean deli meat. Dairy Low-fat (1%) or fat-free (skim) milk. Fat-free, low-fat, or reduced-fat cheeses. Nonfat, low-sodium ricotta or cottage cheese. Low-fat or nonfat yogurt. Low-fat, low-sodium cheese. Fats and oils Soft margarine without trans fats. Vegetable oil. Low-fat, reduced-fat, or light mayonnaise and salad dressings (reduced-sodium). Canola, safflower, olive, soybean, and sunflower oils. Avocado. Seasoning and other foods Herbs. Spices. Seasoning mixes without salt.  Unsalted popcorn and pretzels. Fat-free sweets. What foods are not recommended? The items listed may not be a complete list. Talk with your dietitian about what dietary choices are best for you. Grains Baked goods made with fat, such as croissants, muffins, or some breads. Dry pasta or rice meal packs. Vegetables Creamed or fried vegetables. Vegetables in a cheese sauce. Regular canned vegetables (not low-sodium or reduced-sodium). Regular canned tomato sauce and paste (not low-sodium or reduced-sodium). Regular tomato and vegetable juice (not low-sodium or reduced-sodium). Angie Fava. Olives. Fruits Canned fruit in a light or heavy syrup. Fried fruit. Fruit in cream or butter sauce. Meat and other protein foods Fatty cuts of meat. Ribs. Fried meat. Berniece Salines. Sausage. Bologna and other processed lunch meats. Salami. Fatback. Hotdogs. Bratwurst. Salted nuts and seeds. Canned beans with added salt. Canned or smoked fish. Whole eggs or egg yolks. Chicken or [place] with skin. Dairy Whole or 2% milk, cream, and half-and-half. Whole or full-fat cream cheese. Whole-fat or sweetened yogurt. Full-fat cheese. Nondairy creamers. Whipped toppings. Processed cheese and cheese spreads. Fats and oils Butter. Stick margarine. Lard. Shortening. Ghee. Bacon fat. Tropical oils, such as coconut, palm kernel, or palm oil. Seasoning and other foods Salted popcorn and pretzels. Onion salt, garlic salt, seasoned salt, table salt, and sea salt. Worcestershire sauce. Tartar sauce. Barbecue sauce. Teriyaki sauce. Soy sauce, including reduced-sodium. Steak sauce. Canned and packaged gravies. Fish sauce. Oyster sauce. Cocktail sauce. Horseradish that you find on the shelf. Ketchup. Mustard. Meat flavorings and tenderizers. Bouillon cubes. Hot sauce and Tabasco sauce. Premade or packaged marinades. Premade or packaged taco seasonings. Relishes. Regular salad dressings. Where to find more information:  National Heart, Lung, and [place]: https://wilson-eaton.com/  American Heart Association: www.heart.org Summary  The DASH eating plan is a healthy eating plan that has been shown to reduce high blood pressure (hypertension). It may also reduce your risk for type 2 diabetes, heart disease, and stroke.  With the DASH eating plan, you should limit salt (sodium) intake to 2,300 mg a day. If you have hypertension, you may need to reduce your sodium intake to 1,500 mg a day.  When on the DASH eating plan, aim to eat more fresh fruits and vegetables, whole grains, lean proteins, low-fat dairy, and heart-healthy fats.  Work with your health care provider or diet and nutrition specialist (dietitian) to adjust your eating plan to your individual calorie needs. This information is not intended to replace advice given to you by your health care provider. Make sure you discuss any questions you have with your health care provider. Document Released: 11/21/2011 Document Revised: 11/25/2016 Document Reviewed: 11/25/2016 Elsevier Interactive Patient Education  2019 [employer].

## 2019-05-18 NOTE — Progress Notes (Signed)
Telemedicine Encounter- SOAP NOTE Established Patient  This telephone encounter was conducted with the patient's (or proxy's) verbal consent via audio telecommunications: yes   Patient was instructed to have this encounter in a suitably private space; and to only have persons present to whom they give permission to participate. In addition, patient identity was confirmed by use of name plus two identifiers (DOB and address).  I discussed the limitations, risks, security and privacy concerns of performing an evaluation and management service by telephone and the availability of in person appointments. I also discussed with the patient that there may be a patient responsible charge related to this service. The patient expressed understanding and agreed to proceed.  I spent a total of 25 minutes talking with the patient or their proxy.  No chief complaint on file.   Subjective   Erin Gay is a 73 y.o. established patient. Telephone visit today to establish care with myself as PCP, med refills  HPI Pt has hx significant for HTN, HLD, low vitamin D, subclinical hypothyroidism s/p thyroid nodule removal.  HTN: Taking HCTZ 25mg  PO qd with good effect, plan to continue. Will have vitals measured at lab visit  HLD: Attempting to control with diet and exercise - HDL in good range at 76, however, LDL and Total Cholesterol continue to be elevated. Discussed with patient that if they continue to be elevated, we may have to initiate statin therapy - low intensity statin likely.   Low Vit D: Taking Vit D3 and Ca supplements - was able to bring it back wnl. Will recheck today.  Hypothyroidism: wnl at last check, will confirm today. Asymptomatic at this time.  Patient Active Problem List   Diagnosis Date Noted  . Subclinical hypothyroidism 11/29/2017  . Hyperlipidemia 11/29/2017  . Essential hypertension 11/29/2017  . Vitamin D deficiency 11/29/2017  . Osteopenia 11/29/2017    Past  Medical History:  Diagnosis Date  . Hyperlipidemia   . Hypertension   . Thyroid disease    nodule removal, TSH has been normal    Current Outpatient Medications  Medication Sig Dispense Refill  . Calcium Carbonate (CALCIUM 600 PO) Take 1 tablet 2 (two) times daily by mouth.    . hydrochlorothiazide (HYDRODIURIL) 25 MG tablet Take 1 tablet (25 mg total) by mouth daily. 180 tablet 0  . Multiple Vitamin (MULTIVITAMIN) capsule Take 1 capsule daily by mouth.     No current facility-administered medications for this visit.     No Known Allergies  Social History   Socioeconomic History  . Marital status: Married    Spouse name: Not on file  . Number of children: 3  . Years of education: Not on file  . Highest education level: Not on file  Occupational History  . Not on file  Social Needs  . Financial resource strain: Not hard at all  . Food insecurity:    Worry: Never true    Inability: Never true  . Transportation needs:    Medical: No    Non-medical: No  Tobacco Use  . Smoking status: Never Smoker  . Smokeless tobacco: Never Used  Substance and Sexual Activity  . Alcohol use: No    Alcohol/week: 0.0 standard drinks  . Drug use: No  . Sexual activity: Not Currently  Lifestyle  . Physical activity:    Days per week: 3 days    Minutes per session: 30 min  . Stress: Not at all  Relationships  . Social connections:  Talks on phone: More than three times a week    Gets together: Three times a week    Attends religious service: More than 4 times per year    Active member of club or organization: No    Attends meetings of clubs or organizations: Never    Relationship status: Married  . Intimate partner violence:    Fear of current or ex partner: No    Emotionally abused: No    Physically abused: No    Forced sexual activity: No  Other Topics Concern  . Not on file  Social History Narrative  . Not on file    Review of Systems  Constitutional: Negative.    HENT: Negative.   Eyes: Negative.   Respiratory: Negative.   Cardiovascular: Negative.   Gastrointestinal: Negative.   Genitourinary: Negative.   Musculoskeletal: Negative.   Skin: Negative.   Neurological: Negative.   Endo/Heme/Allergies: Negative.   Psychiatric/Behavioral: Negative.   All other systems reviewed and are negative.   Objective   Vitals as reported by the patient: There were no vitals filed for this visit.  Diagnoses and all orders for this visit:  Subclinical hypothyroidism -     TSH; Future  Disorder of bone -     Vitamin D, 25-hydroxy; Future  Fatigue, unspecified type -     CBC; Future -     Comprehensive metabolic panel; Future  Mixed hyperlipidemia -     Lipid panel; Future  Essential hypertension -     hydrochlorothiazide (HYDRODIURIL) 25 MG tablet; Take 1 tablet (25 mg total) by mouth daily.   PLAN:  Continue HCTZ 25mg  PO qd, order sent to [facility].  Present for labs at earliest convenience, vitals at lab visit. Will follow up as necessary based on results.  Present for CPE post COVID-19  Patient encouraged to call clinic with any questions, comments, or concerns.    I discussed the assessment and treatment plan with the patient. The patient was provided an opportunity to ask questions and all were answered. The patient agreed with the plan and demonstrated an understanding of the instructions.   The patient was advised to call back or seek an in-person evaluation if the symptoms worsen or if the condition fails to improve as anticipated.  I provided 25 minutes of non-face-to-face time during this encounter.  Maximiano Coss, NP  Primary Care at [facility]

## 2019-05-18 NOTE — Progress Notes (Signed)
Spoke with pt and she needs a TOC with new PCP to manage her medications. She will also need refill sent to her mail order. States she has no other concerns at this time but will need current labs.

## 2019-05-20 ENCOUNTER — Other Ambulatory Visit: Payer: Self-pay

## 2019-05-20 ENCOUNTER — Ambulatory Visit (INDEPENDENT_AMBULATORY_CARE_PROVIDER_SITE_OTHER): Payer: Medicare Other | Admitting: Family Medicine

## 2019-05-20 DIAGNOSIS — E038 Other specified hypothyroidism: Secondary | ICD-10-CM

## 2019-05-20 DIAGNOSIS — E782 Mixed hyperlipidemia: Secondary | ICD-10-CM

## 2019-05-20 DIAGNOSIS — R5383 Other fatigue: Secondary | ICD-10-CM

## 2019-05-20 DIAGNOSIS — E039 Hypothyroidism, unspecified: Secondary | ICD-10-CM | POA: Diagnosis not present

## 2019-05-20 DIAGNOSIS — E785 Hyperlipidemia, unspecified: Secondary | ICD-10-CM | POA: Diagnosis not present

## 2019-05-20 DIAGNOSIS — M899 Disorder of bone, unspecified: Secondary | ICD-10-CM

## 2019-05-21 ENCOUNTER — Other Ambulatory Visit: Payer: Self-pay | Admitting: Registered Nurse

## 2019-05-21 DIAGNOSIS — E038 Other specified hypothyroidism: Secondary | ICD-10-CM

## 2019-05-21 DIAGNOSIS — E782 Mixed hyperlipidemia: Secondary | ICD-10-CM

## 2019-05-21 DIAGNOSIS — E039 Hypothyroidism, unspecified: Secondary | ICD-10-CM

## 2019-05-21 LAB — COMPREHENSIVE METABOLIC PANEL
ALT: 21 IU/L (ref 0–32)
AST: 22 IU/L (ref 0–40)
Albumin/Globulin Ratio: 1.8 (ref 1.2–2.2)
Albumin: 4.2 g/dL (ref 3.7–4.7)
Alkaline Phosphatase: 52 IU/L (ref 39–117)
BUN/Creatinine Ratio: 24 (ref 12–28)
BUN: 21 mg/dL (ref 8–27)
Bilirubin Total: 0.8 mg/dL (ref 0.0–1.2)
CO2: 22 mmol/L (ref 20–29)
Calcium: 9.7 mg/dL (ref 8.7–10.3)
Chloride: 104 mmol/L (ref 96–106)
Creatinine, Ser: 0.87 mg/dL (ref 0.57–1.00)
GFR calc Af Amer: 77 mL/min/{1.73_m2} (ref >59)
GFR calc non Af Amer: 67 mL/min/{1.73_m2} (ref >59)
Globulin, Total: 2.3 g/dL (ref 1.5–4.5)
Glucose: 94 mg/dL (ref 65–99)
Potassium: 4.1 mmol/L (ref 3.5–5.2)
Sodium: 140 mmol/L (ref 134–144)
Total Protein: 6.5 g/dL (ref 6.0–8.5)

## 2019-05-21 LAB — CBC
Hematocrit: 38.7 % (ref 34.0–46.6)
Hemoglobin: 13.1 g/dL (ref 11.1–15.9)
MCH: 29.8 pg (ref 26.6–33.0)
MCHC: 33.9 g/dL (ref 31.5–35.7)
MCV: 88 fL (ref 79–97)
Platelets: 312 10*3/uL (ref 150–450)
RBC: 4.39 x10E6/uL (ref 3.77–5.28)
RDW: 12.3 % (ref 11.7–15.4)
WBC: 6.4 10*3/uL (ref 3.4–10.8)

## 2019-05-21 LAB — LIPID PANEL
Chol/HDL Ratio: 4.2 ratio (ref 0.0–4.4)
Cholesterol, Total: 278 mg/dL — ABNORMAL HIGH (ref 100–199)
HDL: 66 mg/dL (ref >39)
LDL Calculated: 193 mg/dL — ABNORMAL HIGH (ref 0–99)
Triglycerides: 96 mg/dL (ref 0–149)
VLDL Cholesterol Cal: 19 mg/dL (ref 5–40)

## 2019-05-21 LAB — TSH: TSH: 6.33 u[IU]/mL — ABNORMAL HIGH (ref 0.450–4.500)

## 2019-05-21 LAB — VITAMIN D 25 HYDROXY (VIT D DEFICIENCY, FRACTURES): Vit D, 25-Hydroxy: 50.1 ng/mL (ref 30.0–100.0)

## 2019-05-21 MED ORDER — SIMVASTATIN 10 MG PO TABS: 10.0000 mg | ORAL_TABLET | Freq: Every day | ORAL | 3 refills | Status: DC

## 2019-05-21 NOTE — Progress Notes (Signed)
Called patient to discuss lab results including hyperlipidemia and elevated TSH  TSH is likely subclinical hypothyroidism- pt has had in past with spontaneous resolution, will recheck in 6 mos  HLD is continued. We will start simvastatin 10mg  PO qd and recheck again in 6 mos.   Kathrin Ruddy, NP

## 2019-07-29 ENCOUNTER — Encounter: Payer: Self-pay | Admitting: *Deleted

## 2019-07-29 ENCOUNTER — Telehealth: Payer: Self-pay | Admitting: *Deleted

## 2019-07-29 ENCOUNTER — Ambulatory Visit (INDEPENDENT_AMBULATORY_CARE_PROVIDER_SITE_OTHER): Payer: Medicare Other | Admitting: Family Medicine

## 2019-07-29 VITALS — BP 120/72 | Ht 65.0 in | Wt 138.0 lb

## 2019-07-29 DIAGNOSIS — Z0000 Encounter for general adult medical examination without abnormal findings: Secondary | ICD-10-CM | POA: Diagnosis not present

## 2019-07-29 NOTE — Telephone Encounter (Signed)
Please call patient and schedule a follow up for her in December with Rich.

## 2019-07-29 NOTE — Patient Instructions (Addendum)
Thank you for taking time to come for your Medicare Wellness Visit. I appreciate your ongoing commitment to your health goals. Please review the following plan we discussed and let me know if I can assist you in the future.  Julie Greer LPN  Preventive Care 72 Years and Older, Female Preventive care refers to lifestyle choices and visits with your health care provider that can promote health and wellness. This includes:  A yearly physical exam. This is also called an annual well check.  Regular dental and eye exams.  Immunizations.  Screening for certain conditions.  Healthy lifestyle choices, such as diet and exercise. What can I expect for my preventive care visit? Physical exam Your health care provider will check:  Height and weight. These may be used to calculate body mass index (BMI), which is a measurement that tells if you are at a healthy weight.  Heart rate and blood pressure.  Your skin for abnormal spots. Counseling Your health care provider may ask you questions about:  Alcohol, tobacco, and drug use.  Emotional well-being.  Home and relationship well-being.  Sexual activity.  Eating habits.  History of falls.  Memory and ability to understand (cognition).  Work and work environment.  Pregnancy and menstrual history. What immunizations do I need?  Influenza (flu) vaccine  This is recommended every year. Tetanus, diphtheria, and pertussis (Tdap) vaccine  You may need a Td booster every 10 years. Varicella (chickenpox) vaccine  You may need this vaccine if you have not already been vaccinated. Zoster (shingles) vaccine  You may need this after age 60. Pneumococcal conjugate (PCV13) vaccine  One dose is recommended after age 72. Pneumococcal polysaccharide (PPSV23) vaccine  One dose is recommended after age 72. Measles, mumps, and rubella (MMR) vaccine  You may need at least one dose of MMR if you were born in 1957 or later. You may also  need a second dose. Meningococcal conjugate (MenACWY) vaccine  You may need this if you have certain conditions. Hepatitis A vaccine  You may need this if you have certain conditions or if you travel or work in places where you may be exposed to hepatitis A. Hepatitis B vaccine  You may need this if you have certain conditions or if you travel or work in places where you may be exposed to hepatitis B. Haemophilus influenzae type b (Hib) vaccine  You may need this if you have certain conditions. You may receive vaccines as individual doses or as more than one vaccine together in one shot (combination vaccines). Talk with your health care provider about the risks and benefits of combination vaccines. What tests do I need? Blood tests  Lipid and cholesterol levels. These may be checked every 5 years, or more frequently depending on your overall health.  Hepatitis C test.  Hepatitis B test. Screening  Lung cancer screening. You may have this screening every year starting at age 55 if you have a 30-pack-year history of smoking and currently smoke or have quit within the past 15 years.  Colorectal cancer screening. All adults should have this screening starting at age 50 and continuing until age 75. Your health care provider may recommend screening at age 45 if you are at increased risk. You will have tests every 1-10 years, depending on your results and the type of screening test.  Diabetes screening. This is done by checking your blood sugar (glucose) after you have not eaten for a while (fasting). You may have this done every 1-3   years.  Mammogram. This may be done every 1-2 years. Talk with your health care provider about how often you should have regular mammograms.  BRCA-related cancer screening. This may be done if you have a family history of breast, ovarian, tubal, or peritoneal cancers. Other tests  Sexually transmitted disease (STD) testing.  Bone density scan. This is done  to screen for osteoporosis. You may have this done starting at age 72. Follow these instructions at home: Eating and drinking  Eat a diet that includes fresh fruits and vegetables, whole grains, lean protein, and low-fat dairy products. Limit your intake of foods with high amounts of sugar, saturated fats, and salt.  Take vitamin and mineral supplements as recommended by your health care provider.  Do not drink alcohol if your health care provider tells you not to drink.  If you drink alcohol: ? Limit how much you have to 0-1 drink a day. ? Be aware of how much alcohol is in your drink. In the [place] one drink equals one 12 oz bottle of beer (355 mL), one 5 oz glass of wine (148 mL), or one 1 oz glass of hard liquor (44 mL). Lifestyle  Take daily care of your teeth and gums.  Stay active. Exercise for at least 30 minutes on 5 or more days each week.  Do not use any products that contain nicotine or tobacco, such as cigarettes, e-cigarettes, and chewing tobacco. If you need help quitting, ask your health care provider.  If you are sexually active, practice safe sex. Use a condom or other form of protection in order to prevent STIs (sexually transmitted infections).  Talk with your health care provider about taking a low-dose aspirin or statin. What's next?  Go to your health care provider once a year for a well check visit.  Ask your health care provider how often you should have your eyes and teeth checked.  Stay up to date on all vaccines. This information is not intended to replace advice given to you by your health care provider. Make sure you discuss any questions you have with your health care provider. Document Released: 12/29/2015 Document Revised: 11/26/2018 Document Reviewed: 11/26/2018 Elsevier Patient Education  2020 [employer]  

## 2019-07-29 NOTE — Progress Notes (Addendum)
Presents today for [employer] Visit   Date of last exam: 05/18/2019  Interpreter used for this visit?   No  I connected with  Erin Gay on 07/29/19 by a telephone application and verified that I am speaking with the correct person using two identifiers.     Patient Care Team: Maximiano Coss, NP as PCP - General (Adult Health Nurse Practitioner)   Other items to address today  Discussed immunizations Patient will come in for flu shot in September Discussed  Eye/Dental     Other Screening:  Last lipid screening: 05/20/2019  ADVANCE DIRECTIVES: Discussed: yes On File: no Materials Provided: yes (mailed)  Cardiac Risk Factors include: advanced age (>39men, >72 women);hypertension;dyslipidemia  Immunization status:  Immunization History  Administered Date(s) Administered   Influenza,inj,Quad PF,6+ Mos 10/24/2015, 10/01/2017   Pneumococcal Conjugate-13 03/17/2017     Health Maintenance Due  Topic Date Due   Hepatitis C Screening  Nov 06, 1946   MAMMOGRAM  03/23/1997   PNA vac Low Risk Adult (2 of 2 - PPSV23) 03/17/2018   INFLUENZA VACCINE  07/17/2019     Functional Status Survey: Is the patient deaf or have difficulty hearing?: No Does the patient have difficulty seeing, even when wearing glasses/contacts?: No Does the patient have difficulty concentrating, remembering, or making decisions?: No Does the patient have difficulty walking or climbing stairs?: No Does the patient have difficulty dressing or bathing?: No Does the patient have difficulty doing errands alone such as visiting a doctor's office or shopping?: No    6CIT Screen 07/29/2019 09/08/2017  What Year? 0 points 0 points  What month? 0 points 0 points  What time? 0 points 0 points  Count back from 20 0 points 0 points  Months in reverse 0 points 0 points  Repeat phrase 0 points 0 points  Total Score 0 0        Clinical Support from 07/29/2019 in Primary Care at  [place]  AUDIT-C Score  0        Home Environment:   Live one story home No trouble climbing stairs No scattered rugs No grab bars No clutter/adequate     Patient Active Problem List   Diagnosis Date Noted   Subclinical hypothyroidism 11/29/2017   Hyperlipidemia 11/29/2017   Essential hypertension 11/29/2017   Vitamin D deficiency 11/29/2017   Osteopenia 11/29/2017     Past Medical History:  Diagnosis Date   Hyperlipidemia    Hypertension    Thyroid disease    nodule removal, TSH has been normal     Past Surgical History:  Procedure Laterality Date   BREAST SURGERY     COSMETIC SURGERY     EYE SURGERY Bilateral    cataracts   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   thyroid nodule removed  35 years ago   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 per pt   TONSILLECTOMY  73 years old     Family History  Problem Relation Age of Onset   Cancer Mother        pt unsure, colon resection done   Heart disease Mother    Hyperlipidemia Mother    Colon polyps Mother    Heart disease Father    Heart disease Brother    Diabetes Brother        t2dm     Social History   Socioeconomic History   Marital status: Married  Spouse name: Not on file   Number of children: 3   Years of education: Not on file   Highest education level: Not on file  Occupational History   Not on file  Social Needs   Financial resource strain: Not hard at all   Food insecurity    Worry: Never true    Inability: Never true   Transportation needs    Medical: No    Non-medical: No  Tobacco Use   Smoking status: Never Smoker   Smokeless tobacco: Never Used  Substance and Sexual Activity   Alcohol use: No    Alcohol/week: 0.0 standard drinks   Drug use: No   Sexual activity: Not Currently  Lifestyle   Physical activity    Days per week: 3 days    Minutes per session: 30 min   Stress: Not at all  Relationships   Social connections     Talks on phone: More than three times a week    Gets together: Three times a week    Attends religious service: More than 4 times per year    Active member of club or organization: No    Attends meetings of clubs or organizations: Never    Relationship status: Married   Intimate partner violence    Fear of current or ex partner: No    Emotionally abused: No    Physically abused: No    Forced sexual activity: No  Other Topics Concern   Not on file  Social History Narrative   Not on file     No Known Allergies   Prior to Admission medications   Medication Sig Start Date End Date Taking? Authorizing Provider  Calcium Carbonate (CALCIUM 600 PO) Take 1 tablet 2 (two) times daily by mouth.   Yes [provider]  cholecalciferol (VITAMIN D3) 25 MCG (1000 UT) tablet Take 2,000 Units by mouth daily.   Yes [provider]  hydrochlorothiazide (HYDRODIURIL) 25 MG tablet Take 1 tablet (25 mg total) by mouth daily. 05/18/19  Yes Maximiano Coss, NP  Multiple Vitamin (MULTIVITAMIN) capsule Take 1 capsule daily by mouth.   Yes [provider]  simvastatin (ZOCOR) 10 MG tablet Take 1 tablet (10 mg total) by mouth at bedtime. 05/21/19  Yes Maximiano Coss, NP     Depression screen [facility] 2/9 07/29/2019 05/18/2019 05/05/2018 09/18/2017 09/08/2017  Decreased Interest 0 0 0 0 0  Down, Depressed, Hopeless 0 0 0 0 0  PHQ - 2 Score 0 0 0 0 0     Fall Risk  07/29/2019 05/18/2019 05/05/2018 09/18/2017 09/08/2017  Falls in the past year? 0 0 No No No  Number falls in past yr: 0 - - - -  Injury with Fall? 0 0 - - -  Follow up Falls evaluation completed;Education provided;Falls prevention discussed - - - -      PHYSICAL EXAM: Ht 5\' 5"  (1.651 m)   Wt 138 lb (62.6 kg)   BMI 22.96 kg/m    Wt Readings from Last 3 Encounters:  07/29/19 138 lb (62.6 kg)  05/05/18 138 lb (62.6 kg)  11/10/17 139 lb (63 kg)     No exam data present    Physical Exam  1. Medicare annual wellness  visit, subsequent   Education/Counseling provided regarding diet and exercise, prevention of chronic diseases, smoking/tobacco cessation, if applicable, and reviewed "Covered Medicare Preventive Services."

## 2019-07-30 NOTE — Telephone Encounter (Signed)
Good Afternoon,  Please give the office a call to schedule an appointment with Kathrin Ruddy for December We have called multiple times  Thank you

## 2019-08-10 ENCOUNTER — Other Ambulatory Visit: Payer: Self-pay | Admitting: Registered Nurse

## 2019-08-10 DIAGNOSIS — I10 Essential (primary) hypertension: Secondary | ICD-10-CM

## 2019-08-20 ENCOUNTER — Other Ambulatory Visit: Payer: Self-pay

## 2019-08-20 ENCOUNTER — Ambulatory Visit (INDEPENDENT_AMBULATORY_CARE_PROVIDER_SITE_OTHER): Payer: Medicare Other | Admitting: Registered Nurse

## 2019-08-20 DIAGNOSIS — Z23 Encounter for immunization: Secondary | ICD-10-CM

## 2019-09-28 ENCOUNTER — Other Ambulatory Visit: Payer: Self-pay | Admitting: Registered Nurse

## 2019-09-28 DIAGNOSIS — I10 Essential (primary) hypertension: Secondary | ICD-10-CM

## 2019-11-17 ENCOUNTER — Ambulatory Visit: Payer: Medicare Other | Admitting: Registered Nurse

## 2019-11-19 ENCOUNTER — Other Ambulatory Visit: Payer: Self-pay

## 2019-11-19 ENCOUNTER — Ambulatory Visit (INDEPENDENT_AMBULATORY_CARE_PROVIDER_SITE_OTHER): Payer: Medicare Other | Admitting: Registered Nurse

## 2019-11-19 ENCOUNTER — Encounter: Payer: Self-pay | Admitting: Registered Nurse

## 2019-11-19 VITALS — BP 130/76 | HR 80 | Temp 98.0°F | Wt 139.6 lb

## 2019-11-19 DIAGNOSIS — Z789 Other specified health status: Secondary | ICD-10-CM

## 2019-11-19 DIAGNOSIS — E039 Hypothyroidism, unspecified: Secondary | ICD-10-CM | POA: Diagnosis not present

## 2019-11-19 DIAGNOSIS — E038 Other specified hypothyroidism: Secondary | ICD-10-CM

## 2019-11-19 DIAGNOSIS — E782 Mixed hyperlipidemia: Secondary | ICD-10-CM | POA: Diagnosis not present

## 2019-11-19 NOTE — Patient Instructions (Signed)
° ° ° °  If you have lab work done today you will be contacted with your lab results within the next 2 weeks.  If you have not heard from us then please contact us. The fastest way to get your results is to register for My Chart. ° ° °IF you received an x-ray today, you will receive an invoice from [facility]. Please contact [facility] at [phone] with questions or concerns regarding your invoice.  ° °IF you received labwork today, you will receive an invoice from [facility]. Please contact [facility] at [phone] with questions or concerns regarding your invoice.  ° °Our billing staff will not be able to assist you with questions regarding bills from these companies. ° °You will be contacted with the lab results as soon as they are available. The fastest way to get your results is to activate your My Chart account. Instructions are located on the last page of this paperwork. If you have not heard from us regarding the results in 2 weeks, please contact this office. °  ° ° ° °

## 2019-11-20 ENCOUNTER — Encounter: Payer: Self-pay | Admitting: Registered Nurse

## 2019-11-20 DIAGNOSIS — E782 Mixed hyperlipidemia: Secondary | ICD-10-CM

## 2019-11-20 DIAGNOSIS — E039 Hypothyroidism, unspecified: Secondary | ICD-10-CM

## 2019-11-20 DIAGNOSIS — E038 Other specified hypothyroidism: Secondary | ICD-10-CM

## 2019-11-20 LAB — LIPID PANEL
Chol/HDL Ratio: 4 ratio (ref 0.0–4.4)
Cholesterol, Total: 305 mg/dL — ABNORMAL HIGH (ref 100–199)
HDL: 77 mg/dL (ref >39)
LDL Chol Calc (NIH): 213 mg/dL — ABNORMAL HIGH (ref 0–99)
Triglycerides: 91 mg/dL (ref 0–149)
VLDL Cholesterol Cal: 15 mg/dL (ref 5–40)

## 2019-11-20 LAB — URINALYSIS
Bilirubin, UA: NEGATIVE
Glucose, UA: NEGATIVE
Ketones, UA: NEGATIVE
Leukocytes,UA: NEGATIVE
Nitrite, UA: NEGATIVE
Protein,UA: NEGATIVE
RBC, UA: NEGATIVE
Specific Gravity, UA: 1.022 (ref 1.005–1.030)
Urobilinogen, Ur: 0.2 mg/dL (ref 0.2–1.0)
pH, UA: 7.5 (ref 5.0–7.5)

## 2019-11-20 LAB — TSH: TSH: 4.89 u[IU]/mL — ABNORMAL HIGH (ref 0.450–4.500)

## 2019-11-20 MED ORDER — PRAVASTATIN SODIUM 20 MG PO TABS: 20.0000 mg | ORAL_TABLET | Freq: Every day | ORAL | 1 refills | Status: DC

## 2019-11-20 NOTE — Progress Notes (Signed)
Established Patient Office Visit  Subjective:  Patient ID: Erin Gay, female    DOB: 11/30/1946  Age: 73 y.o. MRN: [id number]  CC:  Chief Complaint  Patient presents with  . Medical Management of Chronic Issues    6 month f/u on chronic medical condition. Patient stated she have stopped the simvastatin due to it made her achy real bad. After she stopped this med she started feeling so much better. Patient stated she is willing to try another rx if need be    HPI Erin Gay presents for 6 mo follow up.   Notes that after starting simvastatin, developed aches and stopped taking it.  Trying to control diet and exercise.  Denies changes to urine or symptoms of respiratory infection. Still taking HCTZ with good effect. Denies CV symptoms.  Past Medical History:  Diagnosis Date  . Hyperlipidemia   . Hypertension   . Thyroid disease    nodule removal, TSH has been normal    Past Surgical History:  Procedure Laterality Date  . BREAST SURGERY    . COSMETIC SURGERY    . EYE SURGERY Bilateral    cataracts  . 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 TONSILLECTOMY  73 years old    Family History  Problem Relation Age of Onset  . Cancer Mother        pt unsure, colon resection done  . Heart disease Mother   . Hyperlipidemia Mother   . Colon polyps Mother   . Heart disease Father   . Heart disease Brother   . Diabetes Brother        t2dm    Social History   Socioeconomic History  . Marital status: Married    Spouse name: Not on file  . Number of children: 3  . Years of education: Not on file  . Highest education level: Not on file  Occupational History  . Not on file  Social Needs  . Financial resource strain: Not hard at all  . Food insecurity    Worry: Never true    Inability: Never true  . Transportation needs   Medical: No    Non-medical: No  Tobacco Use  . Smoking status: Never Smoker  . Smokeless tobacco: Never Used  Substance and Sexual Activity  . Alcohol use: No    Alcohol/week: 0.0 standard drinks  . Drug use: No  . Sexual activity: Not Currently  Lifestyle  . Physical activity    Days per week: 3 days    Minutes per session: 30 min  . Stress: Not at all  Relationships  . Social connections    Talks on phone: More than three times a week    Gets together: Three times a week    Attends religious service: More than 4 times per year    Active member of club or organization: No    Attends meetings of clubs or organizations: Never    Relationship status: Married  . Intimate partner violence    Fear of current or ex partner: No    Emotionally abused: No    Physically abused: No    Forced sexual activity: No  Other Topics Concern  . Not on file  Social History Narrative  . Not on file    Outpatient Medications Prior to  Visit  Medication Sig Dispense Refill  . Calcium Carbonate (CALCIUM 600 PO) Take 1 tablet 2 (two) times daily by mouth.    . cholecalciferol (VITAMIN D3) 25 MCG (1000 UT) tablet Take 2,000 Units by mouth daily.    . hydrochlorothiazide (HYDRODIURIL) 25 MG tablet TAKE 1 TABLET BY MOUTH  DAILY 90 tablet 0  . Multiple Vitamin (MULTIVITAMIN) capsule Take 1 capsule daily by mouth.    . simvastatin (ZOCOR) 10 MG tablet Take 1 tablet (10 mg total) by mouth at bedtime. 90 tablet 3   No facility-administered medications prior to visit.     No Known Allergies  ROS Review of Systems  Constitutional: Negative.   HENT: Negative.   Eyes: Negative.   Respiratory: Negative.   Cardiovascular: Negative.  Negative for chest pain, palpitations and leg swelling.  Gastrointestinal: Negative.   Endocrine: Negative.   Genitourinary: Negative.   Musculoskeletal: Negative.   Skin: Negative.   Allergic/Immunologic: Negative.   Neurological: Negative.   Hematological:  Negative.   Psychiatric/Behavioral: Negative.       Objective:    Physical Exam  Constitutional: She is oriented to person, place, and time. She appears well-developed and well-nourished. No distress.  Cardiovascular: Normal rate and regular rhythm.  Pulmonary/Chest: Effort normal. No respiratory distress.  Abdominal: Soft. Bowel sounds are normal.  Neurological: She is alert and oriented to person, place, and time.  Skin: Skin is warm and dry. No rash noted. She is not diaphoretic. No erythema. No pallor.  Psychiatric: She has a normal mood and affect. Her behavior is normal. Judgment and thought content normal.  Nursing note and vitals reviewed.   BP 130/76 (BP Location: Right Arm, Patient Position: Sitting, Cuff Size: Normal)   Pulse 80   Temp 98 F (36.7 C) (Oral)   Wt 139 lb 9.6 oz (63.3 kg)   SpO2 97%   BMI 23.23 kg/m  Wt Readings from Last 3 Encounters:  11/19/19 139 lb 9.6 oz (63.3 kg)  07/29/19 138 lb (62.6 kg)  05/05/18 138 lb (62.6 kg)     Health Maintenance Due  Topic Date Due  . MAMMOGRAM  03/23/1997    There are no preventive care reminders to display for this patient.  Lab Results  Component Value Date   TSH 4.890 (H) 11/19/2019   Lab Results  Component Value Date   WBC 6.4 05/20/2019   HGB 13.1 05/20/2019   HCT 38.7 05/20/2019   MCV 88 05/20/2019   PLT 312 05/20/2019   Lab Results  Component Value Date   NA 140 05/20/2019   K 4.1 05/20/2019   CO2 22 05/20/2019   GLUCOSE 94 05/20/2019   BUN 21 05/20/2019   CREATININE 0.87 05/20/2019   BILITOT 0.8 05/20/2019   ALKPHOS 52 05/20/2019   AST 22 05/20/2019   ALT 21 05/20/2019   PROT 6.5 05/20/2019   ALBUMIN 4.2 05/20/2019   CALCIUM 9.7 05/20/2019   Lab Results  Component Value Date   CHOL 305 (H) 11/19/2019   Lab Results  Component Value Date   HDL 77 11/19/2019   Lab Results  Component Value Date   LDLCALC 213 (H) 11/19/2019   Lab Results  Component Value Date   TRIG 91  11/19/2019   Lab Results  Component Value Date   CHOLHDL 4.0 11/19/2019   No results found for: HGBA1C    Assessment & Plan:   Problem List Items Addressed This Visit      Endocrine   Subclinical hypothyroidism  Other   Hyperlipidemia    Other Visit Diagnoses    Statin intolerance    -  Primary   Relevant Orders   Urinalysis (Completed)      No orders of the defined types were placed in this encounter.   Follow-up: No follow-ups on file.   PLAN  Will recheck lipids today - she states she had previously tolerated pravastatin - may try this if her lipids continue to be elevated. Otherwise, will be forced to consider other options, perhaps GI referral for consideration of fluvastatin or other agents that are more expensive but more well tolerated  Discussed the risk of elevated lipids regarding adverse CV events  Will plan to have her return in around 6 mos for med check and labs  Patient encouraged to call clinic with any questions, comments, or concerns.   Maximiano Coss, NP

## 2019-12-01 ENCOUNTER — Telehealth: Payer: Self-pay | Admitting: Registered Nurse

## 2019-12-01 NOTE — Telephone Encounter (Signed)
PA REQUEST PROCESSED. KEY [id number]  MEDICATION COVERED UNDER PLAN

## 2020-01-14 ENCOUNTER — Other Ambulatory Visit: Payer: Self-pay | Admitting: Registered Nurse

## 2020-01-14 DIAGNOSIS — I10 Essential (primary) hypertension: Secondary | ICD-10-CM

## 2020-09-30 ENCOUNTER — Ambulatory Visit: Payer: Medicare Other | Attending: Internal Medicine

## 2020-09-30 DIAGNOSIS — Z23 Encounter for immunization: Secondary | ICD-10-CM

## 2020-09-30 NOTE — Progress Notes (Signed)
   Covid-19 Vaccination Clinic  Name:  Erin Gay    MRN: [id number] DOB: Nov 22, 1946  09/30/2020  Ms. Erin Gay was observed post Covid-19 immunization for 15 minutes without incident. She was provided with Vaccine Information Sheet and instruction to access the V-Safe system.   Ms. Erin Gay was instructed to call 911 with any severe reactions post vaccine: Marland Kitchen Difficulty breathing  . Swelling of face and throat  . A fast heartbeat  . A bad rash all over body  . Dizziness and weakness

## 2020-11-13 ENCOUNTER — Other Ambulatory Visit: Payer: Self-pay | Admitting: Registered Nurse

## 2020-11-13 DIAGNOSIS — I10 Essential (primary) hypertension: Secondary | ICD-10-CM

## 2020-11-14 ENCOUNTER — Other Ambulatory Visit: Payer: Self-pay

## 2020-11-14 DIAGNOSIS — I10 Essential (primary) hypertension: Secondary | ICD-10-CM

## 2020-11-14 MED ORDER — HYDROCHLOROTHIAZIDE 25 MG PO TABS: 25.0000 mg | ORAL_TABLET | Freq: Every day | ORAL | 0 refills | Status: DC

## 2020-11-24 ENCOUNTER — Ambulatory Visit: Payer: Medicare Other

## 2020-11-27 ENCOUNTER — Encounter: Payer: Self-pay | Admitting: Registered Nurse

## 2020-11-27 ENCOUNTER — Ambulatory Visit (INDEPENDENT_AMBULATORY_CARE_PROVIDER_SITE_OTHER): Payer: Medicare Other | Admitting: Registered Nurse

## 2020-11-27 ENCOUNTER — Other Ambulatory Visit: Payer: Self-pay

## 2020-11-27 VITALS — BP 146/74 | HR 91 | Temp 97.6°F | Resp 18 | Ht 65.0 in | Wt 131.2 lb

## 2020-11-27 DIAGNOSIS — Z0001 Encounter for general adult medical examination with abnormal findings: Secondary | ICD-10-CM

## 2020-11-27 DIAGNOSIS — Z8639 Personal history of other endocrine, nutritional and metabolic disease: Secondary | ICD-10-CM | POA: Diagnosis not present

## 2020-11-27 DIAGNOSIS — I10 Essential (primary) hypertension: Secondary | ICD-10-CM

## 2020-11-27 DIAGNOSIS — M8580 Other specified disorders of bone density and structure, unspecified site: Secondary | ICD-10-CM

## 2020-11-27 DIAGNOSIS — E559 Vitamin D deficiency, unspecified: Secondary | ICD-10-CM

## 2020-11-27 DIAGNOSIS — E785 Hyperlipidemia, unspecified: Secondary | ICD-10-CM

## 2020-11-27 DIAGNOSIS — E038 Other specified hypothyroidism: Secondary | ICD-10-CM

## 2020-11-27 DIAGNOSIS — Z0000 Encounter for general adult medical examination without abnormal findings: Secondary | ICD-10-CM

## 2020-11-27 DIAGNOSIS — Z23 Encounter for immunization: Secondary | ICD-10-CM | POA: Diagnosis not present

## 2020-11-27 NOTE — Progress Notes (Signed)
Erin Gay is a pleasant 74 y.o. female presenting today for Medicare Annual Wellness Visit.  Date of last exam: 07/29/19  Interpreter used for this visit? no  Patient Care Team: Maximiano Coss, NP as PCP - General (Adult Health Nurse Practitioner)   Other items to address today: hld, vaccinations  Other Screening: Last screening for diabetes:  No results found for: HGBA1C  Last lipid screening: Lab Results  Component Value Date   CHOL 305 (H) 11/19/2019   HDL 77 11/19/2019   [place] 213 (H) 11/19/2019   TRIG 91 11/19/2019   CHOLHDL 4.0 11/19/2019     ADVANCE DIRECTIVES: Discussed: yes On File: no Materials Provided: declined  Immunization status:  Immunization History  Administered Date(s) Administered   Fluad Quad(high Dose 65+) 08/20/2019, 11/27/2020   Influenza,inj,Quad PF,6+ Mos 10/24/2015, 10/01/2017   PFIZER SARS-COV-2 Vaccination 09/10/2020, 09/30/2020   Pneumococcal Conjugate-13 03/17/2017   Pneumococcal Polysaccharide-23 08/20/2019     There are no preventive care reminders to display for this patient.   Functional Status Survey:     6CIT Screen 07/29/2019 09/08/2017  What Year? 0 points 0 points  What month? 0 points 0 points  What time? 0 points 0 points  Count back from 20 0 points 0 points  Months in reverse 0 points 0 points  Repeat phrase 0 points 0 points  Total Score 0 0      Flowsheet Row Office Visit from 07/29/2019 in Primary Care at [place]  AUDIT-C Score 0       Home Environment: lives with husband. Safe. No tripping hazards. 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   thyroid nodule removed  55 years ago   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 per pt   TONSILLECTOMY  74 years old     Family History  Problem Relation Age of Onset   Cancer Mother        pt unsure, colon resection done   Heart disease Mother    Hyperlipidemia Mother    Colon polyps Mother    Heart disease Father    Heart disease Brother    Diabetes Brother        t2dm     Social History   Socioeconomic History   Marital status: Married    Spouse name: Not on file   Number of children: 3   Years of education: Not on file   Highest education level: Not on file  Occupational History   Not on file  Tobacco Use   Smoking status: Never Smoker   Smokeless tobacco: Never Used  Vaping Use   Vaping Use: Never used  Substance and Sexual Activity   Alcohol use: No    Alcohol/week: 0.0 standard drinks   Drug use: No   Sexual activity: Not Currently  Other Topics Concern   Not  on file  Social History Narrative   Not on file   Social Determinants of Health   Financial Resource Strain: Not on file  Food Insecurity: Not on file  Transportation Needs: Not on file  Physical Activity: Not on file  Stress: Not on file  Social Connections: Not on file  Intimate Partner Violence: Not on file     No Known Allergies   Prior to Admission medications   Medication Sig Start Date End Date Taking? Authorizing Provider  Calcium Carbonate (CALCIUM 600 PO) Take 1 tablet 2 (two) times daily by mouth.   Yes [provider]  cholecalciferol (VITAMIN D3) 25 MCG (1000 UT) tablet Take 2,000 Units by mouth daily.   Yes [provider]  hydrochlorothiazide (HYDRODIURIL) 25 MG tablet Take 1 tablet (25 mg total) by mouth daily.  11/14/20  Yes Maximiano Coss, NP  Multiple Vitamin (MULTIVITAMIN) capsule Take 1 capsule daily by mouth.   Yes [provider]  pravastatin (PRAVACHOL) 20 MG tablet Take 1 tablet (20 mg total) by mouth daily. Patient not taking: Reported on 11/27/2020 11/20/19   Maximiano Coss, NP     Depression screen [facility] 2/9 11/27/2020 11/19/2019 07/29/2019 05/18/2019 05/05/2018  Decreased Interest 0 0 0 0 0  Down, Depressed, Hopeless 0 0 0 0 0  PHQ - 2 Score 0 0 0 0 0     Fall Risk  11/27/2020 11/19/2019 07/29/2019 05/18/2019 05/05/2018  Falls in the past year? 0 0 0 0 No  Number falls in past yr: 0 0 0 - -  Injury with Fall? 0 0 0 0 -  Follow up Falls evaluation completed Falls evaluation completed Falls evaluation completed;Education provided;Falls prevention discussed - -      PHYSICAL EXAM: BP (!) 146/74    Pulse 91    Temp 97.6 F (36.4 C) (Temporal)    Resp 18    Ht 5\' 5"  (1.651 m)    Wt 131 lb 3.2 oz (59.5 kg)    SpO2 95%    BMI 21.83 kg/m  These represent patient reported vital signs taken at home. As this was a visit taking place via video chat, vital signs were not taken in office.   Wt Readings from Last 3 Encounters:  11/27/20 131 lb 3.2 oz (59.5 kg)  11/19/19 139 lb 9.6 oz (63.3 kg)  07/29/19 138 lb (62.6 kg)     No exam data present    Physical Exam Vitals and nursing note reviewed.  Constitutional:      Appearance: Normal appearance.  Cardiovascular:     Rate and Rhythm: Normal rate and regular rhythm.  Pulmonary:     Effort: Pulmonary effort is normal. No respiratory distress.  Skin:    General: Skin is warm and dry.  Neurological:     General: No focal deficit present.     Mental Status: She is alert and oriented to person, place, and time. Mental status is at baseline.  Psychiatric:        Mood and Affect: Mood normal.        Behavior: Behavior normal.        Thought Content: Thought content normal.        Judgment: Judgment normal.       Education/Counseling provided regarding diet and exercise, prevention of chronic diseases, smoking/tobacco cessation, if applicable, and reviewed "Covered Medicare Preventive Services."   ASSESSMENT/PLAN: 1. Vitamin D deficiency Labs collected. Will follow up with the patient as warranted.   2. Osteopenia,  unspecified location Labs collected. Will follow up with the patient as warranted.   3. Hyperlipidemia, unspecified hyperlipidemia type Labs collected. Will follow up with the patient as warranted. Consider alternative to statin given past intolerance  4. Essential hypertension wnl today Continue weight loss  5. Subclinical hypothyroidism Labs collected. Will follow up with the patient as warranted.   6. Flu vaccine need Given today  7. History of elevated glucose Labs collected. Will follow up with the patient as warranted.      Maximiano Coss, NP

## 2020-11-27 NOTE — Patient Instructions (Signed)
° ° ° °  If you have lab work done today you will be contacted with your lab results within the next 2 weeks.  If you have not heard from us then please contact us. The fastest way to get your results is to register for My Chart. ° ° °IF you received an x-ray today, you will receive an invoice from [facility]. Please contact [facility] at [phone] with questions or concerns regarding your invoice.  ° °IF you received labwork today, you will receive an invoice from [facility]. Please contact [facility] at [phone] with questions or concerns regarding your invoice.  ° °Our billing staff will not be able to assist you with questions regarding bills from these companies. ° °You will be contacted with the lab results as soon as they are available. The fastest way to get your results is to activate your My Chart account. Instructions are located on the last page of this paperwork. If you have not heard from us regarding the results in 2 weeks, please contact this office. °  ° ° ° °

## 2020-11-28 ENCOUNTER — Other Ambulatory Visit: Payer: Self-pay | Admitting: Registered Nurse

## 2020-11-28 DIAGNOSIS — E785 Hyperlipidemia, unspecified: Secondary | ICD-10-CM

## 2020-11-28 LAB — CBC WITH DIFFERENTIAL
Basophils Absolute: 0.1 10*3/uL (ref 0.0–0.2)
Basos: 1 %
EOS (ABSOLUTE): 0.2 10*3/uL (ref 0.0–0.4)
Eos: 3 %
Hematocrit: 39.7 % (ref 34.0–46.6)
Hemoglobin: 13.6 g/dL (ref 11.1–15.9)
Immature Grans (Abs): 0 10*3/uL (ref 0.0–0.1)
Immature Granulocytes: 0 %
Lymphocytes Absolute: 1.7 10*3/uL (ref 0.7–3.1)
Lymphs: 23 %
MCH: 30 pg (ref 26.6–33.0)
MCHC: 34.3 g/dL (ref 31.5–35.7)
MCV: 88 fL (ref 79–97)
Monocytes Absolute: 0.8 10*3/uL (ref 0.1–0.9)
Monocytes: 12 %
Neutrophils Absolute: 4.5 10*3/uL (ref 1.4–7.0)
Neutrophils: 61 %
RBC: 4.53 x10E6/uL (ref 3.77–5.28)
RDW: 12.8 % (ref 11.7–15.4)
WBC: 7.3 10*3/uL (ref 3.4–10.8)

## 2020-11-28 LAB — COMPREHENSIVE METABOLIC PANEL
ALT: 16 IU/L (ref 0–32)
AST: 19 IU/L (ref 0–40)
Albumin/Globulin Ratio: 1.8 (ref 1.2–2.2)
Albumin: 4.4 g/dL (ref 3.7–4.7)
Alkaline Phosphatase: 74 IU/L (ref 44–121)
BUN/Creatinine Ratio: 29 — ABNORMAL HIGH (ref 12–28)
BUN: 21 mg/dL (ref 8–27)
Bilirubin Total: 0.5 mg/dL (ref 0.0–1.2)
CO2: 23 mmol/L (ref 20–29)
Calcium: 10.5 mg/dL — ABNORMAL HIGH (ref 8.7–10.3)
Chloride: 102 mmol/L (ref 96–106)
Creatinine, Ser: 0.72 mg/dL (ref 0.57–1.00)
GFR calc Af Amer: 96 mL/min/{1.73_m2} (ref >59)
GFR calc non Af Amer: 83 mL/min/{1.73_m2} (ref >59)
Globulin, Total: 2.5 g/dL (ref 1.5–4.5)
Glucose: 111 mg/dL — ABNORMAL HIGH (ref 65–99)
Potassium: 3.7 mmol/L (ref 3.5–5.2)
Sodium: 138 mmol/L (ref 134–144)
Total Protein: 6.9 g/dL (ref 6.0–8.5)

## 2020-11-28 LAB — TSH: TSH: 4.17 u[IU]/mL (ref 0.450–4.500)

## 2020-11-28 LAB — LIPID PANEL
Chol/HDL Ratio: 4.6 ratio — ABNORMAL HIGH (ref 0.0–4.4)
Cholesterol, Total: 297 mg/dL — ABNORMAL HIGH (ref 100–199)
HDL: 65 mg/dL (ref >39)
LDL Chol Calc (NIH): 203 mg/dL — ABNORMAL HIGH (ref 0–99)
Triglycerides: 158 mg/dL — ABNORMAL HIGH (ref 0–149)
VLDL Cholesterol Cal: 29 mg/dL (ref 5–40)

## 2020-11-28 LAB — HEMOGLOBIN A1C
Est. average glucose Bld gHb Est-mCnc: 111 mg/dL
Hgb A1c MFr Bld: 5.5 % (ref 4.8–5.6)

## 2020-11-28 LAB — VITAMIN D 25 HYDROXY (VIT D DEFICIENCY, FRACTURES): Vit D, 25-Hydroxy: 63.9 ng/mL (ref 30.0–100.0)

## 2020-11-28 MED ORDER — FLUVASTATIN SODIUM 40 MG PO CAPS: 40.0000 mg | ORAL_CAPSULE | ORAL | 0 refills | Status: DC

## 2020-12-27 ENCOUNTER — Other Ambulatory Visit: Payer: Self-pay | Admitting: Registered Nurse

## 2020-12-27 DIAGNOSIS — I10 Essential (primary) hypertension: Secondary | ICD-10-CM

## 2021-03-19 ENCOUNTER — Emergency Department (INDEPENDENT_AMBULATORY_CARE_PROVIDER_SITE_OTHER): Payer: Medicare Other

## 2021-03-19 ENCOUNTER — Emergency Department
Admission: EM | Admit: 2021-03-19 | Discharge: 2021-03-19 | Disposition: A | Discharge status: Home / Self Care | Payer: Medicare Other | Source: Home / Self Care | Attending: Family Medicine | Admitting: Family Medicine

## 2021-03-19 DIAGNOSIS — R0602 Shortness of breath: Secondary | ICD-10-CM | POA: Diagnosis not present

## 2021-03-19 DIAGNOSIS — T7840XA Allergy, unspecified, initial encounter: Secondary | ICD-10-CM

## 2021-03-19 MED ORDER — PREDNISONE 20 MG PO TABS: 20.0000 mg | ORAL_TABLET | Freq: Two times a day (BID) | ORAL | 0 refills | Status: DC

## 2021-03-19 NOTE — ED Provider Notes (Signed)
[facility]    CSN: [id number] Arrival date & time: 03/19/21  1352      History   Chief Complaint Chief Complaint  Patient presents with  . Shortness of Breath    HPI Erin Gay is a 75 y.o. female.   HPI Pleasant 75 year old woman who is here for shortness of breath.  She states that she has had this for couple of days.  She is short of breath with any exertion but also feels short of breath even lying on the couch.  No fever or chills.  She does have a dry cough.  No runny nose or sore throat.  No headache or body aches.  She has have her Covid vaccinations.  She has not had Covid.  She has not had any exposure to Covid. She also complains of "pain in my ovaries".  She called her OB/GYN who ordered an ultrasound.  They were unable to perform it until April 14.  She states that it is getting somewhat better. She also mentions that on 03/15/2021 she had some bleeding with a bowel movement.  It was an isolated incident.  It has not happened again She also mentions that since December she has lost 15 pounds without trying.  I let her know that this is potentially serious and I want her to make an appointment with her primary care doctor to discuss  Past Medical History:  Diagnosis Date  . Hyperlipidemia   . Hypertension   . Thyroid disease    nodule removal, TSH has been normal    Patient Active Problem List   Diagnosis Date Noted  . Subclinical hypothyroidism 11/29/2017  . Hyperlipidemia 11/29/2017  . Essential hypertension 11/29/2017  . Vitamin D deficiency 11/29/2017  . Osteopenia 11/29/2017    Past Surgical History:  Procedure Laterality Date  . BREAST SURGERY    . COSMETIC SURGERY    . EYE SURGERY Bilateral    cataracts  . 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 TONSILLECTOMY  75 years old    OB History   No  obstetric history on file.      Home Medications    Prior to Admission medications   Medication Sig Start Date End Date Taking? Authorizing Provider  Calcium Carbonate (CALCIUM 600 PO) Take 1 tablet 2 (two) times daily by mouth.    [provider]  cholecalciferol (VITAMIN D3) 25 MCG (1000 UT) tablet Take 2,000 Units by mouth daily.    [provider]  hydrochlorothiazide (HYDRODIURIL) 25 MG tablet TAKE 1 TABLET BY MOUTH  DAILY 12/28/20   Maximiano Coss, NP  Multiple Vitamin (MULTIVITAMIN) capsule Take 1 capsule daily by mouth.    [provider]  pravastatin (PRAVACHOL) 20 MG tablet Take 1 tablet (20 mg total) by mouth daily. Patient not taking: Reported on 11/27/2020 11/20/19   Maximiano Coss, NP  predniSONE (DELTASONE) 20 MG tablet Take 1 tablet (20 mg total) by mouth 2 (two) times daily with a meal. 03/19/21   Raylene Everts, MD  fluvastatin (LESCOL) 40 MG capsule Take 1 capsule (40 mg total) by mouth every other day. 11/28/20 03/19/21  Maximiano Coss, NP    Family History Family History  Problem Relation Age of Onset  . Cancer Mother        pt unsure, colon resection  done  . Heart disease Mother   . Hyperlipidemia Mother   . Colon polyps Mother   . Heart disease Father   . Heart disease Brother   . Diabetes Brother        t2dm    Social History Social History   Tobacco Use  . Smoking status: Never Smoker  . Smokeless tobacco: Never Used  Vaping Use  . Vaping Use: Never used  Substance Use Topics  . Alcohol use: No    Alcohol/week: 0.0 standard drinks  . Drug use: No     Allergies   Patient has no known allergies.   Review of Systems Review of Systems See HPI  Physical Exam Triage Vital Signs ED Triage Vitals  Enc Vitals Group     BP 03/19/21 1413 118/76     Pulse Rate 03/19/21 1413 87     Resp 03/19/21 1413 20     Temp 03/19/21 1413 97.7 F (36.5 C)     Temp src --      SpO2 03/19/21 1413 99 %     Weight 03/19/21 1408  126 lb (57.2 kg)     Height 03/19/21 1408 5\' 4"  (1.626 m)     Head Circumference --      Peak Flow --      Pain Score 03/19/21 1407 0     Pain Loc --      Pain Edu? --      Excl. in [place]? --    No data found.  Updated Vital Signs BP 118/76 (BP Location: Right Arm)   Pulse 87   Temp 97.7 F (36.5 C)   Resp 20   Ht 5\' 4"  (1.626 m)   Wt 57.2 kg   SpO2 99%   BMI 21.63 kg/m   Physical Exam Constitutional:      General: She is not in acute distress.    Appearance: She is well-developed and normal weight.  HENT:     Head: Normocephalic and atraumatic.     Right Ear: Tympanic membrane and ear canal normal.     Left Ear: Tympanic membrane and ear canal normal.     Nose: Nose normal. No congestion.     Mouth/Throat:     Mouth: Mucous membranes are moist.     Pharynx: No posterior oropharyngeal erythema.  Eyes:     Conjunctiva/sclera: Conjunctivae normal.     Pupils: Pupils are equal, round, and reactive to light.  Cardiovascular:     Rate and Rhythm: Normal rate and regular rhythm.     Heart sounds: Normal heart sounds.  Pulmonary:     Effort: Pulmonary effort is normal. No respiratory distress.     Breath sounds: Wheezing present.     Comments: Few scattered wheeze at end of inspiration.  Wheeze also noted with coughing Abdominal:     General: There is no distension.     Palpations: Abdomen is soft.     Tenderness: There is abdominal tenderness.     Comments: Mild tenderness in the right and left lower quadrants.  No guarding or rebound.  No mass palpable  Musculoskeletal:        General: Normal range of motion.     Cervical back: Normal range of motion.  Lymphadenopathy:     Cervical: No cervical adenopathy.  Skin:    General: Skin is warm and dry.  Neurological:     Mental Status: She is alert.  Psychiatric:  Behavior: Behavior normal.      UC Treatments / Results  Labs (all labs ordered are listed, but only abnormal results are displayed) Labs  Reviewed - No data to display  EKG   Radiology DG Chest 2 View  Result Date: 03/19/2021 CLINICAL DATA:  Shortness of breath EXAM: CHEST - 2 VIEW COMPARISON:  None. FINDINGS: The lungs are clear. The heart size and pulmonary vascularity are normal. No adenopathy. There is aortic atherosclerosis. There are foci of degenerative change in the thoracic spine. IMPRESSION: Lungs clear. Cardiac silhouette normal. Aortic Atherosclerosis (ICD10-I70.0). Electronically Signed   By: Lowella Grip III M.D.   On: 03/19/2021 15:47    Procedures Procedures (including critical care time)  Medications Ordered in UC Medications - No data to display  Initial Impression / Assessment and Plan / UC Course  I have reviewed the triage vital signs and the nursing notes.  Pertinent labs & imaging results that were available during my care of the patient were reviewed by me and considered in my medical decision making (see chart for details).     We will give prednisone for likely allergies causing the shortness of breath and wheezing.  She also has some clear runny nose.  Is advised to follow-up with her primary care doctor regarding her weight loss.  She will have an ultrasound for her lower quadrant pain in April. Final Clinical Impressions(s) / UC Diagnoses   Final diagnoses:  SOB (shortness of breath)  Allergy, initial encounter     Discharge Instructions     Drink plenty of water Take the prednisone 2 x a day Follow up with primary care about weight loss   ED Prescriptions    Medication Sig Dispense Auth. Provider   predniSONE (DELTASONE) 20 MG tablet  (Status: Discontinued) Take 1 tablet (20 mg total) by mouth 2 (two) times daily with a meal. 10 tablet Raylene Everts, MD   predniSONE (DELTASONE) 20 MG tablet Take 1 tablet (20 mg total) by mouth 2 (two) times daily with a meal. 10 tablet Raylene Everts, MD     PDMP not reviewed this encounter.   Raylene Everts, MD 03/19/21  [phone]

## 2021-03-19 NOTE — ED Notes (Signed)
Pt transported to [facility] via wheelchair.

## 2021-03-19 NOTE — ED Notes (Signed)
Pt returned to room  

## 2021-03-19 NOTE — ED Triage Notes (Addendum)
Was sick on 3/21 with a cough and sore throat. Following this sickness pt reports some dizziness that has been off and on. Pt also st she was having cramping pain near her ovaries and was scheduled for a vaginal ultrasound but they were unable to get the probe interested.  Pt presents with SOB and a light cough c/o "not been her self in 3 weeks". Pt is sob when speaking to me.    pt states she has also lost about 15 lbs since december and has not been trying to loose weight

## 2021-03-19 NOTE — Discharge Instructions (Signed)
Drink plenty of water Take the prednisone 2 x a day Follow up with primary care about weight loss

## 2021-03-29 DIAGNOSIS — Z1231 Encounter for screening mammogram for malignant neoplasm of breast: Secondary | ICD-10-CM | POA: Diagnosis not present

## 2021-04-13 ENCOUNTER — Encounter: Payer: Self-pay | Admitting: Registered Nurse

## 2021-04-13 ENCOUNTER — Other Ambulatory Visit: Payer: Self-pay

## 2021-04-13 ENCOUNTER — Ambulatory Visit (INDEPENDENT_AMBULATORY_CARE_PROVIDER_SITE_OTHER): Payer: Medicare Other | Admitting: Registered Nurse

## 2021-04-13 VITALS — BP 142/61 | HR 68 | Temp 98.0°F | Resp 17 | Ht 64.0 in | Wt 127.0 lb

## 2021-04-13 DIAGNOSIS — R634 Abnormal weight loss: Secondary | ICD-10-CM | POA: Diagnosis not present

## 2021-04-13 LAB — CBC WITH DIFFERENTIAL/PLATELET
Basophils Absolute: 0.1 10*3/uL (ref 0.0–0.1)
Basophils Relative: 0.6 % (ref 0.0–3.0)
Eosinophils Absolute: 0.1 10*3/uL (ref 0.0–0.7)
Eosinophils Relative: 1.7 % (ref 0.0–5.0)
HCT: 38.6 % (ref 36.0–46.0)
Hemoglobin: 13 g/dL (ref 12.0–15.0)
Lymphocytes Relative: 26.9 % (ref 12.0–46.0)
Lymphs Abs: 2.2 10*3/uL (ref 0.7–4.0)
MCHC: 33.8 g/dL (ref 30.0–36.0)
MCV: 86.6 fl (ref 78.0–100.0)
Monocytes Absolute: 1 10*3/uL (ref 0.1–1.0)
Monocytes Relative: 11.8 % (ref 3.0–12.0)
Neutro Abs: 4.8 10*3/uL (ref 1.4–7.7)
Neutrophils Relative %: 59 % (ref 43.0–77.0)
Platelets: 420 10*3/uL — ABNORMAL HIGH (ref 150.0–400.0)
RBC: 4.46 Mil/uL (ref 3.87–5.11)
RDW: 14.1 % (ref 11.5–15.5)
WBC: 8.1 10*3/uL (ref 4.0–10.5)

## 2021-04-13 LAB — COMPREHENSIVE METABOLIC PANEL
ALT: 16 U/L (ref 0–35)
AST: 20 U/L (ref 0–37)
Albumin: 4.2 g/dL (ref 3.5–5.2)
Alkaline Phosphatase: 68 U/L (ref 39–117)
BUN: 19 mg/dL (ref 6–23)
CO2: 28 mEq/L (ref 19–32)
Calcium: 11.4 mg/dL — ABNORMAL HIGH (ref 8.4–10.5)
Chloride: 101 mEq/L (ref 96–112)
Creatinine, Ser: 0.78 mg/dL (ref 0.40–1.20)
GFR: 75.01 mL/min (ref >60.00)
Glucose, Bld: 78 mg/dL (ref 70–99)
Potassium: 3.5 mEq/L (ref 3.5–5.1)
Sodium: 138 mEq/L (ref 135–145)
Total Bilirubin: 0.9 mg/dL (ref 0.2–1.2)
Total Protein: 7.8 g/dL (ref 6.0–8.3)

## 2021-04-13 NOTE — Patient Instructions (Signed)
° ° ° °  If you have lab work done today you will be contacted with your lab results within the next 2 weeks.  If you have not heard from us then please contact us. The fastest way to get your results is to register for My Chart. ° ° °IF you received an x-ray today, you will receive an invoice from [facility]. Please contact [facility] at [phone] with questions or concerns regarding your invoice.  ° °IF you received labwork today, you will receive an invoice from [facility]. Please contact [facility] at [phone] with questions or concerns regarding your invoice.  ° °Our billing staff will not be able to assist you with questions regarding bills from these companies. ° °You will be contacted with the lab results as soon as they are available. The fastest way to get your results is to activate your My Chart account. Instructions are located on the last page of this paperwork. If you have not heard from us regarding the results in 2 weeks, please contact this office. °  ° ° ° °

## 2021-04-14 ENCOUNTER — Emergency Department (HOSPITAL_BASED_OUTPATIENT_CLINIC_OR_DEPARTMENT_OTHER): Payer: Medicare Other

## 2021-04-14 ENCOUNTER — Encounter (HOSPITAL_BASED_OUTPATIENT_CLINIC_OR_DEPARTMENT_OTHER): Payer: Self-pay

## 2021-04-14 ENCOUNTER — Emergency Department (HOSPITAL_BASED_OUTPATIENT_CLINIC_OR_DEPARTMENT_OTHER)
Admission: EM | Admit: 2021-04-14 | Discharge: 2021-04-14 | Disposition: A | Discharge status: Home / Self Care | Payer: Medicare Other | Attending: Emergency Medicine | Admitting: Emergency Medicine

## 2021-04-14 DIAGNOSIS — Z79899 Other long term (current) drug therapy: Secondary | ICD-10-CM | POA: Diagnosis not present

## 2021-04-14 DIAGNOSIS — I10 Essential (primary) hypertension: Secondary | ICD-10-CM | POA: Insufficient documentation

## 2021-04-14 DIAGNOSIS — N200 Calculus of kidney: Secondary | ICD-10-CM

## 2021-04-14 DIAGNOSIS — R1032 Left lower quadrant pain: Secondary | ICD-10-CM | POA: Diagnosis not present

## 2021-04-14 DIAGNOSIS — K769 Liver disease, unspecified: Secondary | ICD-10-CM | POA: Diagnosis not present

## 2021-04-14 DIAGNOSIS — K5900 Constipation, unspecified: Secondary | ICD-10-CM

## 2021-04-14 DIAGNOSIS — Q630 Accessory kidney: Secondary | ICD-10-CM | POA: Diagnosis not present

## 2021-04-14 DIAGNOSIS — K3189 Other diseases of stomach and duodenum: Secondary | ICD-10-CM | POA: Diagnosis not present

## 2021-04-14 DIAGNOSIS — K6389 Other specified diseases of intestine: Secondary | ICD-10-CM | POA: Diagnosis not present

## 2021-04-14 LAB — COMPREHENSIVE METABOLIC PANEL WITH GFR
ALT: 20 U/L (ref 0–44)
AST: 25 U/L (ref 15–41)
Albumin: 3.9 g/dL (ref 3.5–5.0)
Alkaline Phosphatase: 65 U/L (ref 38–126)
Anion gap: 12 (ref 5–15)
BUN: 23 mg/dL (ref 8–23)
CO2: 22 mmol/L (ref 22–32)
Calcium: 10.4 mg/dL — ABNORMAL HIGH (ref 8.9–10.3)
Chloride: 103 mmol/L (ref 98–111)
Creatinine, Ser: 0.84 mg/dL (ref 0.44–1.00)
GFR, Estimated: >60 mL/min
Glucose, Bld: 113 mg/dL — ABNORMAL HIGH (ref 70–99)
Potassium: 3.4 mmol/L — ABNORMAL LOW (ref 3.5–5.1)
Sodium: 137 mmol/L (ref 135–145)
Total Bilirubin: 0.4 mg/dL (ref 0.3–1.2)
Total Protein: 7.9 g/dL (ref 6.5–8.1)

## 2021-04-14 LAB — CBC WITH DIFFERENTIAL/PLATELET
Abs Immature Granulocytes: 0.09 K/uL — ABNORMAL HIGH (ref 0.00–0.07)
Basophils Absolute: 0.1 K/uL (ref 0.0–0.1)
Basophils Relative: 1 %
Eosinophils Absolute: 0.2 K/uL (ref 0.0–0.5)
Eosinophils Relative: 1 %
HCT: 39.9 % (ref 36.0–46.0)
Hemoglobin: 13.2 g/dL (ref 12.0–15.0)
Immature Granulocytes: 1 %
Lymphocytes Relative: 22 %
Lymphs Abs: 2.4 K/uL (ref 0.7–4.0)
MCH: 29.1 pg (ref 26.0–34.0)
MCHC: 33.1 g/dL (ref 30.0–36.0)
MCV: 87.9 fL (ref 80.0–100.0)
Monocytes Absolute: 1.4 K/uL — ABNORMAL HIGH (ref 0.1–1.0)
Monocytes Relative: 12 %
Neutro Abs: 7.2 K/uL (ref 1.7–7.7)
Neutrophils Relative %: 63 %
Platelets: 488 K/uL — ABNORMAL HIGH (ref 150–400)
RBC: 4.54 MIL/uL (ref 3.87–5.11)
RDW: 13.8 % (ref 11.5–15.5)
WBC: 11.3 K/uL — ABNORMAL HIGH (ref 4.0–10.5)
nRBC: 0 % (ref 0.0–0.2)

## 2021-04-14 LAB — URINALYSIS, MICROSCOPIC (REFLEX)

## 2021-04-14 LAB — LIPASE, BLOOD: Lipase: 30 U/L (ref 11–51)

## 2021-04-14 LAB — URINALYSIS, ROUTINE W REFLEX MICROSCOPIC
Bilirubin Urine: NEGATIVE
Glucose, UA: NEGATIVE mg/dL
Ketones, ur: NEGATIVE mg/dL
Leukocytes,Ua: NEGATIVE
Nitrite: NEGATIVE
Protein, ur: NEGATIVE mg/dL
Specific Gravity, Urine: 1.02 (ref 1.005–1.030)
pH: 7 (ref 5.0–8.0)

## 2021-04-14 LAB — THYROID PANEL WITH TSH
Free Thyroxine Index: 1.9 (ref 1.4–3.8)
T3 Uptake: 25 % (ref 22–35)
T4, Total: 7.6 ug/dL (ref 5.1–11.9)
TSH: 5.2 mIU/L — ABNORMAL HIGH (ref 0.40–4.50)

## 2021-04-14 MED ORDER — TAMSULOSIN HCL 0.4 MG PO CAPS: 0.4000 mg | ORAL_CAPSULE | Freq: Every day | ORAL | 0 refills | Status: AC

## 2021-04-14 MED ORDER — ONDANSETRON HCL 4 MG PO TABS: 4.0000 mg | ORAL_TABLET | Freq: Three times a day (TID) | ORAL | 0 refills | Status: DC | PRN

## 2021-04-14 MED ORDER — NAPROXEN 500 MG PO TABS: 500.0000 mg | ORAL_TABLET | Freq: Two times a day (BID) | ORAL | 0 refills | Status: AC

## 2021-04-14 NOTE — ED Triage Notes (Signed)
Pt c/o LLQ abdominal pain for the past month. More intense pain since 0630 this am. Erin Gay to PCP yesterday who states needs CT. C/o nausea/dry heaving, normal BM, last one yesterday.

## 2021-04-14 NOTE — ED Notes (Signed)
ED Provider at bedside. 

## 2021-04-14 NOTE — Discharge Instructions (Addendum)
You have been seen and discharged from the emergency department.  Your CAT scan identifies a kidney stone.  Take medications as prescribed, stay well-hydrated.  Your CAT scan also identified constipation.  Use over-the-counter MiraLAX daily for gentle relief.  There was also an area of the transverse colon that was inflamed.  Please have this evaluated by a GI doctor with potential colonoscopy.  Follow-up with your primary provider for reevaluation and further care. Take home medications as prescribed. If you have any worsening symptoms, severe side pain, inability to urinate, high fevers or further concerns for your health please return to an emergency department for further evaluation.

## 2021-04-14 NOTE — ED Provider Notes (Signed)
[facility] Provider Note   CSN: [id number] Arrival date & time: 04/14/21  4782     History Chief Complaint  Patient presents with  . Abdominal Pain    Erin Gay is a 75 y.o. female.  HPI   75 year old female past medical history of HTN, HLD presents emergency department with left lower quadrant abdominal pain.  Patient states this has been going on intermittently for the past month.  She is been seen by her OB/GYN, have an ultrasound, no issues with her ovaries.  She states the pain usually self resolves however since this morning the pain has been severe, sharp.  She has had associated nausea and dry heaving but denies any diarrhea, last normal bowel movement was yesterday.  Denies any history of diverticulosis or kidney stones.  No known history of AAA.  Denies any fever.  She had 1 episode of bloody bowel movement about a month ago but no further rectal bleeding.  Denies any hematuria/dysuria.  Past Medical History:  Diagnosis Date  . Hyperlipidemia   . Hypertension   . Thyroid disease    nodule removal, TSH has been normal    Patient Active Problem List   Diagnosis Date Noted  . Subclinical hypothyroidism 11/29/2017  . Hyperlipidemia 11/29/2017  . Essential hypertension 11/29/2017  . Vitamin D deficiency 11/29/2017  . Osteopenia 11/29/2017    Past Surgical History:  Procedure Laterality Date  . BREAST SURGERY    . COSMETIC SURGERY    . EYE SURGERY Bilateral    cataracts  . 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 TONSILLECTOMY  75 years old     OB History   No obstetric history on file.     Family History  Problem Relation Age of Onset  . Cancer Mother        pt unsure, colon resection done  . Heart disease Mother   . Hyperlipidemia Mother   . Colon polyps Mother   . Heart disease  Father   . Heart disease Brother   . Diabetes Brother        t2dm    Social History   Tobacco Use  . Smoking status: Never Smoker  . Smokeless tobacco: Never Used  Vaping Use  . Vaping Use: Never used  Substance Use Topics  . Alcohol use: No    Alcohol/week: 0.0 standard drinks  . Drug use: No    Home Medications Prior to Admission medications   Medication Sig Start Date End Date Taking? Authorizing Provider  Calcium Carbonate (CALCIUM 600 PO) Take 1 tablet 2 (two) times daily by mouth.    [provider]  cholecalciferol (VITAMIN D3) 25 MCG (1000 UT) tablet Take 2,000 Units by mouth daily.    [provider]  hydrochlorothiazide (HYDRODIURIL) 25 MG tablet TAKE 1 TABLET BY MOUTH  DAILY 12/28/20   Maximiano Coss, NP  Multiple Vitamin (MULTIVITAMIN) capsule Take 1 capsule daily by mouth.    [provider]  pravastatin (PRAVACHOL) 20 MG tablet Take 1 tablet (20 mg total) by mouth daily. Patient not taking: No sig reported 11/20/19   Maximiano Coss, NP  predniSONE (DELTASONE) 20 MG tablet Take 1 tablet (20 mg total) by mouth 2 (two) times daily with a meal. Patient not taking: Reported on 04/13/2021 03/19/21   Meda Coffee,  Jennette Banker, MD  fluvastatin (LESCOL) 40 MG capsule Take 1 capsule (40 mg total) by mouth every other day. 11/28/20 03/19/21  Maximiano Coss, NP    Allergies    Patient has no known allergies.  Review of Systems   Review of Systems  Constitutional: Positive for appetite change. Negative for chills and fever.  HENT: Negative for congestion.   Eyes: Negative for visual disturbance.  Respiratory: Negative for shortness of breath.   Cardiovascular: Negative for chest pain.  Gastrointestinal: Positive for abdominal pain and nausea. Negative for anal bleeding, constipation, diarrhea and vomiting.  Genitourinary: Negative for dysuria, flank pain and hematuria.  Musculoskeletal: Positive for back pain.  Skin: Negative for rash.  Neurological:  Negative for headaches.    Physical Exam Updated Vital Signs BP (!) 151/68   Pulse 71   Temp 98.8 F (37.1 C) (Oral)   Resp 18   SpO2 100%   Physical Exam Vitals and nursing note reviewed.  Constitutional:      Appearance: Normal appearance.  HENT:     Head: Normocephalic.     Mouth/Throat:     Mouth: Mucous membranes are moist.  Cardiovascular:     Rate and Rhythm: Normal rate.  Pulmonary:     Effort: Pulmonary effort is normal. No respiratory distress.  Abdominal:     Palpations: Abdomen is soft.     Tenderness: There is abdominal tenderness in the periumbilical area and left lower quadrant. There is no guarding.  Skin:    General: Skin is warm.  Neurological:     Mental Status: She is alert and oriented to person, place, and time. Mental status is at baseline.  Psychiatric:        Mood and Affect: Mood normal.     ED Results / Procedures / Treatments   Labs (all labs ordered are listed, but only abnormal results are displayed) Labs Reviewed  COMPREHENSIVE METABOLIC PANEL - Abnormal; Notable for the following components:      Result Value   Potassium 3.4 (*)    Glucose, Bld 113 (*)    Calcium 10.4 (*)    All other components within normal limits  CBC WITH DIFFERENTIAL/PLATELET - Abnormal; Notable for the following components:   WBC 11.3 (*)    Platelets 488 (*)    Monocytes Absolute 1.4 (*)    Abs Immature Granulocytes 0.09 (*)    All other components within normal limits  LIPASE, BLOOD  URINALYSIS, ROUTINE W REFLEX MICROSCOPIC    EKG None  Radiology No results found.  Procedures Procedures   Medications Ordered in ED Medications - No data to display  ED Course  I have reviewed the triage vital signs and the nursing notes.  Pertinent labs & imaging results that were available during my care of the patient were reviewed by me and considered in my medical decision making (see chart for details).    MDM Rules/Calculators/A&P                           75 year old female presents to the emergency department with concern for flank pain.  Vitals are stable on arrival, abdomen is benign.  Urinalysis had a lot of blood in it.  CAT scan identifies a left kidney stone.  Kidney function is normal, no UTI.  It also shows constipation with inflammation of the distal part of the transverse colon with lymphadenopathy.  This could be concerning for malignancy, I have relayed these  findings on the CAT scan to the patient and the goal for GI consult to rule out malignancy.  Currently patient's symptoms are controlled, kidney stone is small and should pass on its own.  Patient sent with symptomatic treatment and instructions for GI follow-up.  Daughter at bedside and they both understand.  Patient will be discharged and treated as an outpatient.  Discharge plan and strict return to ED precautions discussed, patient verbalizes understanding and agreement.  Final Clinical Impression(s) / ED Diagnoses Final diagnoses:  None    Rx / DC Orders ED Discharge Orders    None       Lorelle Gibbs, DO 04/14/21 1357

## 2021-04-17 DIAGNOSIS — R933 Abnormal findings on diagnostic imaging of other parts of digestive tract: Secondary | ICD-10-CM | POA: Diagnosis not present

## 2021-04-17 DIAGNOSIS — R1310 Dysphagia, unspecified: Secondary | ICD-10-CM | POA: Diagnosis not present

## 2021-04-18 DIAGNOSIS — R1310 Dysphagia, unspecified: Secondary | ICD-10-CM | POA: Diagnosis not present

## 2021-04-18 DIAGNOSIS — R933 Abnormal findings on diagnostic imaging of other parts of digestive tract: Secondary | ICD-10-CM | POA: Diagnosis not present

## 2021-04-20 DIAGNOSIS — Z538 Procedure and treatment not carried out for other reasons: Secondary | ICD-10-CM | POA: Diagnosis not present

## 2021-04-20 DIAGNOSIS — C185 Malignant neoplasm of splenic flexure: Secondary | ICD-10-CM | POA: Diagnosis not present

## 2021-04-20 DIAGNOSIS — K5669 Other partial intestinal obstruction: Secondary | ICD-10-CM | POA: Diagnosis not present

## 2021-04-20 DIAGNOSIS — K922 Gastrointestinal hemorrhage, unspecified: Secondary | ICD-10-CM | POA: Diagnosis not present

## 2021-04-20 DIAGNOSIS — K921 Melena: Secondary | ICD-10-CM | POA: Diagnosis not present

## 2021-04-24 DIAGNOSIS — C185 Malignant neoplasm of splenic flexure: Secondary | ICD-10-CM | POA: Diagnosis not present

## 2021-04-26 DIAGNOSIS — K769 Liver disease, unspecified: Secondary | ICD-10-CM | POA: Diagnosis not present

## 2021-04-26 DIAGNOSIS — C185 Malignant neoplasm of splenic flexure: Secondary | ICD-10-CM | POA: Diagnosis not present

## 2021-05-01 DIAGNOSIS — M65322 Trigger finger, left index finger: Secondary | ICD-10-CM | POA: Diagnosis not present

## 2021-05-01 DIAGNOSIS — C185 Malignant neoplasm of splenic flexure: Secondary | ICD-10-CM | POA: Insufficient documentation

## 2021-05-11 DIAGNOSIS — Z01812 Encounter for preprocedural laboratory examination: Secondary | ICD-10-CM | POA: Diagnosis not present

## 2021-05-11 DIAGNOSIS — C185 Malignant neoplasm of splenic flexure: Secondary | ICD-10-CM | POA: Diagnosis not present

## 2021-05-11 DIAGNOSIS — I10 Essential (primary) hypertension: Secondary | ICD-10-CM | POA: Diagnosis not present

## 2021-05-11 DIAGNOSIS — Z01818 Encounter for other preprocedural examination: Secondary | ICD-10-CM | POA: Diagnosis not present

## 2021-05-30 ENCOUNTER — Other Ambulatory Visit: Payer: Self-pay | Admitting: Registered Nurse

## 2021-05-30 DIAGNOSIS — I10 Essential (primary) hypertension: Secondary | ICD-10-CM

## 2021-06-01 ENCOUNTER — Ambulatory Visit (INDEPENDENT_AMBULATORY_CARE_PROVIDER_SITE_OTHER): Payer: Medicare Other | Admitting: Registered Nurse

## 2021-06-01 ENCOUNTER — Encounter: Payer: Self-pay | Admitting: Registered Nurse

## 2021-06-01 ENCOUNTER — Other Ambulatory Visit: Payer: Self-pay

## 2021-06-01 VITALS — BP 110/58 | HR 71 | Temp 98.3°F | Ht 64.0 in | Wt 126.2 lb

## 2021-06-01 DIAGNOSIS — E039 Hypothyroidism, unspecified: Secondary | ICD-10-CM

## 2021-06-01 DIAGNOSIS — E785 Hyperlipidemia, unspecified: Secondary | ICD-10-CM | POA: Diagnosis not present

## 2021-06-01 DIAGNOSIS — I10 Essential (primary) hypertension: Secondary | ICD-10-CM

## 2021-06-01 NOTE — Progress Notes (Signed)
Established Patient Office Visit  Subjective:  Patient ID: Erin Gay, female    DOB: [birth year]  Age: 75 y.o. MRN: [id number]  CC:  Chief Complaint  Patient presents with   Abdominal Pain    Patient states she had some stomach pain for over a month. Patient went and had ultrasound but no issues.    HPI Erin Gay presents for abdominal pain Around 1 mo Aching pain, bloating Does note abnormal weight loss - has not been dieting or exercising congruent with her loss of around 15 lbs (>10% of body weight) in the past 3-4 mo. Does not endorse further symptoms of thyroid dysfunction No bowel changes, no nvd Colonoscopy in 2018 wnl, recommended 10 year recall.  Of concern, long family hx of malignancies as detailed in her family history. 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   thyroid nodule removed  35 years ago   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 per pt   TONSILLECTOMY  75 years old    Family History  Problem Relation Age of Onset   Cancer Mother        pt unsure, colon resection done   Heart disease Mother    Hyperlipidemia Mother    Colon polyps Mother    Heart disease Father    Heart disease Brother    Diabetes Brother        t2dm    Social History   Socioeconomic History   Marital status: Married    Spouse name: Not on file   Number of children: 3   Years of education: Not on file   Highest education level: Not on file  Occupational History   Not on file  Tobacco Use   Smoking status: Never   Smokeless tobacco: Never  Vaping Use   Vaping Use: Never used  Substance and Sexual  Activity   Alcohol use: No    Alcohol/week: 0.0 standard drinks   Drug use: No   Sexual activity: Not Currently  Other Topics Concern   Not on file  Social History Narrative   Not on file   Social Determinants of Health   Financial Resource Strain: Not on file  Food Insecurity: Not on file  Transportation Needs: Not on file  Physical Activity: Not on file  Stress: Not on file  Social Connections: Not on file  Intimate Partner Violence: Not on file    Outpatient Medications Prior to Visit  Medication Sig Dispense Refill   Calcium Carbonate (CALCIUM 600 PO) Take 1 tablet 2 (two) times daily by mouth.     cholecalciferol (VITAMIN D3) 25 MCG (1000 UT) tablet Take 2,000 Units by mouth daily.     hydrochlorothiazide (HYDRODIURIL) 25 MG tablet TAKE 1 TABLET BY MOUTH  DAILY 90 tablet 1   Multiple Vitamin (MULTIVITAMIN) capsule Take 1 capsule daily by mouth.     pravastatin (PRAVACHOL) 20 MG tablet Take 1 tablet (20 mg total) by mouth daily. (Patient not taking: No sig reported) 90 tablet 1   predniSONE (DELTASONE) 20  MG tablet Take 1 tablet (20 mg total) by mouth 2 (two) times daily with a meal. (Patient not taking: Reported on 04/13/2021) 10 tablet 0   No facility-administered medications prior to visit.    No Known Allergies  ROS Review of Systems Per hpi   Objective:    Physical Exam Vitals and nursing note reviewed.  Constitutional:      General: She is not in acute distress.    Appearance: Normal appearance. She is normal weight. She is not ill-appearing, toxic-appearing or diaphoretic.  Cardiovascular:     Rate and Rhythm: Normal rate and regular rhythm.     Heart sounds: Normal heart sounds. No murmur heard.   No friction rub. No gallop.  Pulmonary:     Effort: Pulmonary effort is normal. No respiratory distress.     Breath sounds: Normal breath sounds. No stridor. No wheezing, rhonchi or rales.  Chest:     Chest wall: No tenderness.  Abdominal:     General:  Abdomen is flat. Bowel sounds are normal.     Palpations: Abdomen is soft.     Tenderness: There is generalized abdominal tenderness. There is no guarding or rebound.     Hernia: No hernia is present.  Skin:    General: Skin is warm and dry.  Neurological:     General: No focal deficit present.     Mental Status: She is alert and oriented to person, place, and time. Mental status is at baseline.  Psychiatric:        Mood and Affect: Mood normal.        Behavior: Behavior normal.        Thought Content: Thought content normal.        Judgment: Judgment normal.    BP (!) 142/61   Pulse 68   Temp 98 F (36.7 C) (Temporal)   Resp 17   Ht 5\' 4"  (1.626 m)   Wt 127 lb (57.6 kg)   SpO2 99%   BMI 21.80 kg/m  Wt Readings from Last 3 Encounters:  04/14/21 127 lb (57.6 kg)  04/13/21 127 lb (57.6 kg)  03/19/21 126 lb (57.2 kg)     Health Maintenance Due  Topic Date Due   Zoster Vaccines- Shingrix (1 of 2) Never done    There are no preventive care reminders to display for this patient.  Lab Results  Component Value Date   TSH 5.20 (H) 04/13/2021   Lab Results  Component Value Date   WBC 11.3 (H) 04/14/2021   HGB 13.2 04/14/2021   HCT 39.9 04/14/2021   MCV 87.9 04/14/2021   PLT 488 (H) 04/14/2021   Lab Results  Component Value Date   NA 137 04/14/2021   K 3.4 (L) 04/14/2021   CO2 22 04/14/2021   GLUCOSE 113 (H) 04/14/2021   BUN 23 04/14/2021   CREATININE 0.84 04/14/2021   BILITOT 0.4 04/14/2021   ALKPHOS 65 04/14/2021   AST 25 04/14/2021   ALT 20 04/14/2021   PROT 7.9 04/14/2021   ALBUMIN 3.9 04/14/2021   CALCIUM 10.4 (H) 04/14/2021   ANIONGAP 12 04/14/2021   GFR 75.01 04/13/2021   Lab Results  Component Value Date   CHOL 297 (H) 11/27/2020   Lab Results  Component Value Date   HDL 65 11/27/2020   Lab Results  Component Value Date   LDLCALC 203 (H) 11/27/2020   Lab Results  Component Value Date   TRIG 158 (H) 11/27/2020   Lab Results  Component  Value Date   CHOLHDL 4.6 (H) 11/27/2020   Lab Results  Component Value Date   HGBA1C 5.5 11/27/2020      Assessment & Plan:   Problem List Items Addressed This Visit   None Visit Diagnoses     Unintended weight loss    -  Primary   Relevant Orders   Thyroid Panel With TSH (Completed)   CBC with Differential/Platelet (Completed)   Comprehensive metabolic panel (Completed)       No orders of the defined types were placed in this encounter.   Follow-up: No follow-ups on file.   PLAN Start with labs. If no clear etiology, will obtain CT abd/pelv w contrast.  Er precautions given, pt voices understanding. Must work to rule out malignancy Patient encouraged to call clinic with any questions, comments, or concerns. I spent 44 minutes with this patient, more than 50% of which was spent counseling and/or educating.  Maximiano Coss, NP

## 2021-09-07 ENCOUNTER — Telehealth: Payer: Self-pay

## 2021-09-07 ENCOUNTER — Other Ambulatory Visit: Payer: Self-pay

## 2021-09-07 DIAGNOSIS — I10 Essential (primary) hypertension: Secondary | ICD-10-CM

## 2021-09-07 MED ORDER — HYDROCHLOROTHIAZIDE 25 MG PO TABS
25.0000 mg | ORAL_TABLET | Freq: Every day | ORAL | 3 refills | Status: DC
Start: 2021-09-07 — End: 2022-04-28

## 2021-09-07 NOTE — Telephone Encounter (Signed)
Pt needs refill on hydrochlorothiazide (HYDRODIURIL) 25 MG tablet  90 tablets  [facility]  ([place], [place]   Pt call back [phone]

## 2021-09-07 NOTE — Telephone Encounter (Signed)
Rx refilled.

## 2021-09-13 ENCOUNTER — Other Ambulatory Visit: Payer: Self-pay

## 2021-09-13 ENCOUNTER — Ambulatory Visit (INDEPENDENT_AMBULATORY_CARE_PROVIDER_SITE_OTHER): Payer: Medicare Other | Admitting: Registered Nurse

## 2021-09-13 ENCOUNTER — Encounter: Payer: Self-pay | Admitting: Registered Nurse

## 2021-09-13 VITALS — BP 140/69 | HR 72 | Temp 98.3°F | Resp 18 | Ht 64.0 in | Wt 132.4 lb

## 2021-09-13 DIAGNOSIS — R9431 Abnormal electrocardiogram [ECG] [EKG]: Secondary | ICD-10-CM

## 2021-09-13 DIAGNOSIS — R0609 Other forms of dyspnea: Secondary | ICD-10-CM

## 2021-09-13 DIAGNOSIS — R0600 Dyspnea, unspecified: Secondary | ICD-10-CM | POA: Diagnosis not present

## 2021-09-13 DIAGNOSIS — R5382 Chronic fatigue, unspecified: Secondary | ICD-10-CM | POA: Diagnosis not present

## 2021-09-13 NOTE — Patient Instructions (Signed)
° ° ° °  If you have lab work done today you will be contacted with your lab results within the next 2 weeks.  If you have not heard from us then please contact us. The fastest way to get your results is to register for My Chart. ° ° °IF you received an x-ray today, you will receive an invoice from [facility]. Please contact [facility] at [phone] with questions or concerns regarding your invoice.  ° °IF you received labwork today, you will receive an invoice from [facility]. Please contact [facility] at [phone] with questions or concerns regarding your invoice.  ° °Our billing staff will not be able to assist you with questions regarding bills from these companies. ° °You will be contacted with the lab results as soon as they are available. The fastest way to get your results is to activate your My Chart account. Instructions are located on the last page of this paperwork. If you have not heard from us regarding the results in 2 weeks, please contact this office. °  ° ° ° °

## 2021-09-13 NOTE — Progress Notes (Signed)
Established Patient Office Visit  Subjective:  Patient ID: Erin Gay, female    DOB: [birth year]  Age: 75 y.o. MRN: [id number]  CC:  Chief Complaint  Patient presents with   Fatigue    Patient states she has been fatigue and also noticing herself getting dizzy for about 1 month.    HPI Erin Gay presents for fatigue  Worsening over past month Unstable, particularly when bending over and standing up Strange sensation in middle of chest - not tightness or pressure. Pain in upper back, feels like it's penetrating from chest.   Cramping in calves in the past, nothing new or consistent though. Cramps in hands and arms.   No GI or urinary symptoms.  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   thyroid nodule removed  10 years ago   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 per pt   TONSILLECTOMY  75 years old    Family History  Problem Relation Age of Onset   Cancer Mother        pt unsure, colon resection done   Heart disease Mother    Hyperlipidemia Mother    Colon polyps Mother    Heart disease Father    Heart disease Brother    Diabetes Brother        t2dm    Social History   Socioeconomic History   Marital status: Married    Spouse name: Not on file   Number of children: 3   Years of education: Not on file   Highest education level: Not on file  Occupational History   Not on file  Tobacco Use   Smoking status: Never   Smokeless tobacco: Never  Vaping Use   Vaping Use: Never used  Substance and Sexual Activity   Alcohol use: No    Alcohol/week: 0.0 standard drinks   Drug use: No   Sexual  activity: Not Currently  Other Topics Concern   Not on file  Social History Narrative   Not on file   Social Determinants of Health   Financial Resource Strain: Not on file  Food Insecurity: Not on file  Transportation Needs: Not on file  Physical Activity: Not on file  Stress: Not on file  Social Connections: Not on file  Intimate Partner Violence: Not on file    Outpatient Medications Prior to Visit  Medication Sig Dispense Refill   Calcium Carbonate (CALCIUM 600 PO) Take 1 tablet 2 (two) times daily by mouth.     cholecalciferol (VITAMIN D3) 25 MCG (1000 UT) tablet Take 2,000 Units by mouth daily.     hydrochlorothiazide (HYDRODIURIL) 25 MG tablet Take 1 tablet (25 mg total) by mouth daily. 90 tablet 3   Multiple Vitamin (MULTIVITAMIN) capsule Take 1 capsule daily by mouth.     No facility-administered medications prior to visit.    No Known Allergies  ROS Review of Systems  Constitutional: Negative.   HENT: Negative.    Eyes: Negative.   Respiratory:  Positive for chest tightness and shortness of breath.  Negative for apnea, cough, choking, wheezing and stridor.   Cardiovascular: Negative.  Negative for chest pain, palpitations and leg swelling.  Gastrointestinal: Negative.   Endocrine: Negative.   Genitourinary: Negative.   Musculoskeletal: Negative.   Skin: Negative.   Allergic/Immunologic: Negative.   Neurological: Negative.   Hematological: Negative.   Psychiatric/Behavioral: Negative.    All other systems reviewed and are negative.    Objective:    Physical Exam Vitals and nursing note reviewed.  Constitutional:      General: She is not in acute distress.    Appearance: Normal appearance. She is normal weight. She is not ill-appearing, toxic-appearing or diaphoretic.  HENT:     Head: Normocephalic and atraumatic.  Cardiovascular:     Rate and Rhythm: Normal rate and regular rhythm.     Heart sounds: Normal heart sounds. No murmur heard.   No  friction rub. No gallop.  Pulmonary:     Effort: Pulmonary effort is normal. No respiratory distress.     Breath sounds: Normal breath sounds. No stridor. No wheezing, rhonchi or rales.  Chest:     Chest wall: No tenderness.  Skin:    General: Skin is warm and dry.  Neurological:     General: No focal deficit present.     Mental Status: She is alert and oriented to person, place, and time. Mental status is at baseline.  Psychiatric:        Mood and Affect: Mood normal.        Behavior: Behavior normal.        Thought Content: Thought content normal.        Judgment: Judgment normal.    BP 140/69   Pulse 72   Temp 98.3 F (36.8 C) (Temporal)   Resp 18   Ht 5\' 4"  (1.626 m)   Wt 132 lb 6.4 oz (60.1 kg)   SpO2 99%   BMI 22.73 kg/m  Wt Readings from Last 3 Encounters:  09/13/21 132 lb 6.4 oz (60.1 kg)  06/01/21 126 lb 4 oz (57.3 kg)  04/14/21 127 lb (57.6 kg)     There are no preventive care reminders to display for this patient.   There are no preventive care reminders to display for this patient.  Lab Results  Component Value Date   TSH 7.36 (H) 09/13/2021   Lab Results  Component Value Date   WBC 8.7 09/13/2021   HGB 13.6 09/13/2021   HCT 40.8 09/13/2021   MCV 86.6 09/13/2021   PLT 367.0 09/13/2021   Lab Results  Component Value Date   NA 140 09/13/2021   K 3.2 (L) 09/13/2021   CO2 26 09/13/2021   GLUCOSE 73 09/13/2021   BUN 18 09/13/2021   CREATININE 0.86 09/13/2021   BILITOT 1.3 (H) 09/13/2021   ALKPHOS 82 09/13/2021   AST 23 09/13/2021   ALT 20 09/13/2021   PROT 7.6 09/13/2021   ALBUMIN 4.6 09/13/2021   CALCIUM 11.0 (H) 09/13/2021   ANIONGAP 12 04/14/2021   GFR 66.52 09/13/2021   Lab Results  Component Value Date   CHOL 310 (H) 09/13/2021   Lab Results  Component Value Date   HDL 80.80 09/13/2021   Lab Results  Component Value Date   LDLCALC 209 (H) 09/13/2021   Lab Results  Component Value Date   TRIG 101.0 09/13/2021   Lab  Results  Component Value Date   CHOLHDL 4 09/13/2021   Lab Results  Component Value Date   HGBA1C 5.7 09/13/2021  Assessment & Plan:   Problem List Items Addressed This Visit   None Visit Diagnoses     DOE (dyspnea on exertion)    -  Primary   Relevant Orders   EKG 12-Lead (Completed)   CBC with Differential/Platelet (Completed)   Comprehensive metabolic panel (Completed)   Hemoglobin A1c (Completed)   Lipid panel (Completed)   TSH (Completed)   Brain natriuretic peptide   Abnormal EKG       Relevant Orders   CBC with Differential/Platelet (Completed)   Comprehensive metabolic panel (Completed)   Hemoglobin A1c (Completed)   Lipid panel (Completed)   TSH (Completed)   Brain natriuretic peptide   Chronic fatigue       Relevant Orders   CBC with Differential/Platelet (Completed)   Comprehensive metabolic panel (Completed)   Hemoglobin A1c (Completed)   Lipid panel (Completed)   TSH (Completed)   Brain natriuretic peptide       No orders of the defined types were placed in this encounter.   Follow-up: No follow-ups on file.   PLAN Ekg today compared to 09/18/2017. Showing changes. New R wave splitting and question of s-t segment changes. Concerning for contributions to her symptoms. Will refer to cardiology. Labs collected. Will follow up with the patient as warranted. Discussed ER precautions with patient who voices understanding Patient encouraged to call clinic with any questions, comments, or concerns.  Maximiano Coss, NP

## 2021-09-14 ENCOUNTER — Ambulatory Visit: Payer: Medicare Other | Admitting: Registered Nurse

## 2021-09-14 ENCOUNTER — Other Ambulatory Visit (INDEPENDENT_AMBULATORY_CARE_PROVIDER_SITE_OTHER): Payer: Medicare Other

## 2021-09-14 ENCOUNTER — Ambulatory Visit: Payer: Medicare Other

## 2021-09-14 DIAGNOSIS — Z23 Encounter for immunization: Secondary | ICD-10-CM

## 2021-09-14 DIAGNOSIS — R9431 Abnormal electrocardiogram [ECG] [EKG]: Secondary | ICD-10-CM

## 2021-09-14 LAB — COMPREHENSIVE METABOLIC PANEL
ALT: 20 U/L (ref 0–35)
AST: 23 U/L (ref 0–37)
Albumin: 4.6 g/dL (ref 3.5–5.2)
Alkaline Phosphatase: 82 U/L (ref 39–117)
BUN: 18 mg/dL (ref 6–23)
CO2: 26 mEq/L (ref 19–32)
Calcium: 11 mg/dL — ABNORMAL HIGH (ref 8.4–10.5)
Chloride: 100 mEq/L (ref 96–112)
Creatinine, Ser: 0.86 mg/dL (ref 0.40–1.20)
GFR: 66.52 mL/min (ref >60.00)
Glucose, Bld: 73 mg/dL (ref 70–99)
Potassium: 3.2 mEq/L — ABNORMAL LOW (ref 3.5–5.1)
Sodium: 140 mEq/L (ref 135–145)
Total Bilirubin: 1.3 mg/dL — ABNORMAL HIGH (ref 0.2–1.2)
Total Protein: 7.6 g/dL (ref 6.0–8.3)

## 2021-09-14 LAB — CBC WITH DIFFERENTIAL/PLATELET
Basophils Absolute: 0.1 10*3/uL (ref 0.0–0.1)
Basophils Relative: 0.9 % (ref 0.0–3.0)
Eosinophils Absolute: 0.2 10*3/uL (ref 0.0–0.7)
Eosinophils Relative: 2.5 % (ref 0.0–5.0)
HCT: 40.8 % (ref 36.0–46.0)
Hemoglobin: 13.6 g/dL (ref 12.0–15.0)
Lymphocytes Relative: 27.2 % (ref 12.0–46.0)
Lymphs Abs: 2.4 10*3/uL (ref 0.7–4.0)
MCHC: 33.3 g/dL (ref 30.0–36.0)
MCV: 86.6 fl (ref 78.0–100.0)
Monocytes Absolute: 1 10*3/uL (ref 0.1–1.0)
Monocytes Relative: 11.3 % (ref 3.0–12.0)
Neutro Abs: 5.1 10*3/uL (ref 1.4–7.7)
Neutrophils Relative %: 58.1 % (ref 43.0–77.0)
Platelets: 367 10*3/uL (ref 150.0–400.0)
RBC: 4.71 Mil/uL (ref 3.87–5.11)
RDW: 14 % (ref 11.5–15.5)
WBC: 8.7 10*3/uL (ref 4.0–10.5)

## 2021-09-14 LAB — LIPID PANEL
Cholesterol: 310 mg/dL — ABNORMAL HIGH (ref 0–200)
HDL: 80.8 mg/dL (ref >39.00)
LDL Cholesterol: 209 mg/dL — ABNORMAL HIGH (ref 0–99)
NonHDL: 229.21
Total CHOL/HDL Ratio: 4
Triglycerides: 101 mg/dL (ref 0.0–149.0)
VLDL: 20.2 mg/dL (ref 0.0–40.0)

## 2021-09-14 LAB — BRAIN NATRIURETIC PEPTIDE: Pro B Natriuretic peptide (BNP): 42 pg/mL (ref 0.0–100.0)

## 2021-09-14 LAB — HEMOGLOBIN A1C: Hgb A1c MFr Bld: 5.7 % (ref 4.6–6.5)

## 2021-09-14 LAB — TSH: TSH: 7.36 u[IU]/mL — ABNORMAL HIGH (ref 0.35–5.50)

## 2021-09-17 ENCOUNTER — Other Ambulatory Visit: Payer: Self-pay | Admitting: Registered Nurse

## 2021-09-17 DIAGNOSIS — E876 Hypokalemia: Secondary | ICD-10-CM

## 2021-09-17 DIAGNOSIS — Z789 Other specified health status: Secondary | ICD-10-CM

## 2021-09-17 DIAGNOSIS — E039 Hypothyroidism, unspecified: Secondary | ICD-10-CM

## 2021-09-17 MED ORDER — LEVOTHYROXINE SODIUM 50 MCG PO TABS: 50.0000 ug | ORAL_TABLET | Freq: Every day | ORAL | 3 refills | Status: DC

## 2021-09-17 MED ORDER — POTASSIUM CHLORIDE CRYS ER 10 MEQ PO TBCR: 10.0000 meq | EXTENDED_RELEASE_TABLET | Freq: Every day | ORAL | 0 refills | Status: DC

## 2021-09-24 ENCOUNTER — Encounter: Payer: Self-pay | Admitting: Registered Nurse

## 2021-10-02 NOTE — Progress Notes (Signed)
Established Patient Office Visit  Subjective:  Patient ID: Erin Gay, female    DOB: November 06, 1946  Age: 75 y.o. MRN: [id number]  CC:  Chief Complaint  Patient presents with   Follow-up    HPI Erin Gay presents for follow up on htn, hld, hypothyroid  No acute concerns Doing well  Recent labs reviewed  Hypertension: Patient Currently taking: hctz 25mg  po qd Good effect. No AEs. Denies CV symptoms including: chest pain, shob, doe, headache, visual changes, fatigue, claudication, and dependent edema.   Previous readings and labs: BP Readings from Last 3 Encounters:  09/13/21 140/69  06/01/21 (!) 110/58  04/14/21 123/64   Lab Results  Component Value Date   CREATININE 0.86 09/13/2021   Hld Pursuing lifestyle control Hx of statin intolerance Lab Results  Component Value Date   CHOL 310 (H) 09/13/2021   HDL 80.80 09/13/2021   LDLCALC 209 (H) 09/13/2021   TRIG 101.0 09/13/2021   CHOLHDL 4 09/13/2021   Hypothyroid On synthroid 69mcg po qd Good effect, no AE Lab Results  Component Value Date   TSH 7.36 (H) 09/13/2021     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   thyroid nodule removed  35 years ago   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 per pt   TONSILLECTOMY  75 years old    Family History  Problem Relation Age of Onset   Cancer Mother        pt unsure, colon resection done   Heart disease Mother    Hyperlipidemia Mother    Colon polyps Mother    Heart disease Father    Heart disease Brother    Diabetes Brother        t2dm    Social History   Socioeconomic History   Marital status: Married    Spouse name: Not on file    Number of children: 3   Years of education: Not on file   Highest education level: Not on file  Occupational History   Not on file  Tobacco Use   Smoking status: Never   Smokeless tobacco: Never  Vaping Use   Vaping Use: Never used  Substance and Sexual Activity   Alcohol use: No    Alcohol/week: 0.0 standard drinks   Drug use: No   Sexual activity: Not Currently  Other Topics Concern   Not on file  Social History Narrative   Not on file   Social Determinants of Health   Financial Resource Strain: Not on file  Food Insecurity: Not on file  Transportation Needs: Not on file  Physical Activity: Not on file  Stress: Not on file  Social Connections: Not on file  Intimate Partner Violence: Not on file    Outpatient Medications Prior to Visit  Medication Sig Dispense Refill   Calcium Carbonate (CALCIUM 600 PO) Take 1 tablet 2 (two) times daily by mouth.     cholecalciferol (VITAMIN D3) 25 MCG (1000 UT) tablet Take 2,000 Units by mouth daily.     Multiple Vitamin (MULTIVITAMIN) capsule Take 1 capsule daily by mouth.  hydrochlorothiazide (HYDRODIURIL) 25 MG tablet TAKE 1 TABLET BY MOUTH  DAILY 90 tablet 1   ondansetron (ZOFRAN) 4 MG tablet Take 1 tablet (4 mg total) by mouth every 8 (eight) hours as needed for nausea or vomiting. 4 tablet 0   pravastatin (PRAVACHOL) 20 MG tablet Take 1 tablet (20 mg total) by mouth daily. (Patient not taking: No sig reported) 90 tablet 1   predniSONE (DELTASONE) 20 MG tablet Take 1 tablet (20 mg total) by mouth 2 (two) times daily with a meal. (Patient not taking: Reported on 04/13/2021) 10 tablet 0   No facility-administered medications prior to visit.    No Known Allergies  ROS Review of Systems  Constitutional: Negative.   HENT: Negative.    Eyes: Negative.   Respiratory: Negative.    Cardiovascular: Negative.   Gastrointestinal: Negative.   Genitourinary: Negative.   Musculoskeletal: Negative.   Skin: Negative.   Neurological:  Negative.   Psychiatric/Behavioral: Negative.    All other systems reviewed and are negative.    Objective:    Physical Exam Vitals and nursing note reviewed.  Constitutional:      General: She is not in acute distress.    Appearance: Normal appearance. She is normal weight. She is not ill-appearing, toxic-appearing or diaphoretic.  Cardiovascular:     Rate and Rhythm: Normal rate and regular rhythm.     Heart sounds: Normal heart sounds. No murmur heard.   No friction rub. No gallop.  Pulmonary:     Effort: Pulmonary effort is normal. No respiratory distress.     Breath sounds: Normal breath sounds. No stridor. No wheezing, rhonchi or rales.  Chest:     Chest wall: No tenderness.  Skin:    General: Skin is warm and dry.  Neurological:     General: No focal deficit present.     Mental Status: She is alert and oriented to person, place, and time. Mental status is at baseline.  Psychiatric:        Mood and Affect: Mood normal.        Behavior: Behavior normal.        Thought Content: Thought content normal.        Judgment: Judgment normal.    BP (!) 110/58   Pulse 71   Temp 98.3 F (36.8 C)   Ht 5\' 4"  (1.626 m)   Wt 126 lb 4 oz (57.3 kg)   SpO2 98%   BMI 21.67 kg/m  Wt Readings from Last 3 Encounters:  09/13/21 132 lb 6.4 oz (60.1 kg)  06/01/21 126 lb 4 oz (57.3 kg)  04/14/21 127 lb (57.6 kg)     Health Maintenance Due  Topic Date Due   COVID-19 Vaccine (5 - Booster for [employer] series) 01/31/2021    There are no preventive care reminders to display for this patient.  Lab Results  Component Value Date   TSH 7.36 (H) 09/13/2021   Lab Results  Component Value Date   WBC 8.7 09/13/2021   HGB 13.6 09/13/2021   HCT 40.8 09/13/2021   MCV 86.6 09/13/2021   PLT 367.0 09/13/2021   Lab Results  Component Value Date   NA 140 09/13/2021   K 3.2 (L) 09/13/2021   CO2 26 09/13/2021   GLUCOSE 73 09/13/2021   BUN 18 09/13/2021   CREATININE 0.86 09/13/2021    BILITOT 1.3 (H) 09/13/2021   ALKPHOS 82 09/13/2021   AST 23 09/13/2021   ALT 20 09/13/2021   PROT 7.6 09/13/2021  ALBUMIN 4.6 09/13/2021   CALCIUM 11.0 (H) 09/13/2021   ANIONGAP 12 04/14/2021   GFR 66.52 09/13/2021   Lab Results  Component Value Date   CHOL 310 (H) 09/13/2021   Lab Results  Component Value Date   HDL 80.80 09/13/2021   Lab Results  Component Value Date   LDLCALC 209 (H) 09/13/2021   Lab Results  Component Value Date   TRIG 101.0 09/13/2021   Lab Results  Component Value Date   CHOLHDL 4 09/13/2021   Lab Results  Component Value Date   HGBA1C 5.7 09/13/2021      Assessment & Plan:   Problem List Items Addressed This Visit       Cardiovascular and Mediastinum   Essential hypertension     Other   Hyperlipidemia - Primary   Other Visit Diagnoses     Hypothyroidism, unspecified type           No orders of the defined types were placed in this encounter.   Follow-up: No follow-ups on file.   PLAN Continue current regimen Reviewed options for lipids. May need to refer if no improvement on next check. Pt voices understanding of concerns regarding hyperlipidemia. Patient encouraged to call clinic with any questions, comments, or concerns.  Maximiano Coss, NP

## 2021-10-18 NOTE — Progress Notes (Unsigned)
Error

## 2021-11-02 DIAGNOSIS — I447 Left bundle-branch block, unspecified: Secondary | ICD-10-CM | POA: Insufficient documentation

## 2021-11-18 ENCOUNTER — Other Ambulatory Visit: Payer: Self-pay | Admitting: Registered Nurse

## 2021-11-18 DIAGNOSIS — E876 Hypokalemia: Secondary | ICD-10-CM

## 2021-12-04 ENCOUNTER — Encounter: Payer: Self-pay | Admitting: Registered Nurse

## 2021-12-04 ENCOUNTER — Ambulatory Visit (INDEPENDENT_AMBULATORY_CARE_PROVIDER_SITE_OTHER): Payer: Medicare Other | Admitting: Registered Nurse

## 2021-12-04 VITALS — BP 129/66 | HR 67 | Temp 98.0°F | Resp 18 | Ht 64.0 in | Wt 134.2 lb

## 2021-12-04 DIAGNOSIS — R0609 Other forms of dyspnea: Secondary | ICD-10-CM

## 2021-12-04 DIAGNOSIS — I447 Left bundle-branch block, unspecified: Secondary | ICD-10-CM

## 2021-12-04 NOTE — Patient Instructions (Addendum)
°  Ms. Erin Gay -   Erin Gay to see you  Let's touch base in 4 months on thyroid. I'll follow along with Dr. Theodosia Blender notes until then  Call if you need anything  Angela Nevin Christmas to you and Konrad Dolores!  Thank you  Rich    If you have lab work done today you will be contacted with your lab results within the next 2 weeks.  If you have not heard from [place] then please contact us. The fastest way to get your results is to register for My Chart.   IF you received an x-ray today, you will receive an invoice from [facility]. Please contact [facility] at [phone] with questions or concerns regarding your invoice.   IF you received labwork today, you will receive an invoice from [place]. Please contact [facility] at [phone] with questions or concerns regarding your invoice.   Our billing staff will not be able to assist you with questions regarding bills from these companies.  You will be contacted with the lab results as soon as they are available. The fastest way to get your results is to activate your My Chart account. Instructions are located on the last page of this paperwork. If you have not heard from [place] regarding the results in 2 weeks, please contact this office.

## 2021-12-04 NOTE — Progress Notes (Signed)
Established Patient Office Visit  Subjective:  Patient ID: Erin Gay, female    DOB: 03-19-1946  Age: 75 y.o. MRN: [id number]  CC:  Chief Complaint  Patient presents with   Follow-up    Patient states she is here for a follow up. Patient states she was given a new medication and she has not took it because the instructions.    HPI Erin Gay presents for follow up   Since last visit - Has est with cardiology Dr. Radford Pax through [place]. He is concerned that her LBBB is leading to symptomatic bradycardia and has plans for Holter monitor. Also planning to explore a PCSK9 inhibitor at upcoming visit given her hyperlipidemia and statin intolerance.  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 Continues routine follow up without new concern. Due for colonoscopy 1 year post curative surgery, CT scan abd/pel w contrast q1 year for 5 years following dx.  Notes she is feeling well Has not started synthroid.  Shob and doe stable.  Has holter monitor on x 1 mo, no interpretation back yet. 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   thyroid nodule removed  55 years ago   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 per pt   TONSILLECTOMY  75 years old    Family History  Problem Relation Age of Onset   Cancer Mother        pt unsure, colon resection done   Heart disease Mother    Hyperlipidemia Mother    Colon polyps Mother    Heart disease Father    Heart disease Brother    Diabetes Brother        t2dm    Social History   Socioeconomic History   Marital status:  Married    Spouse name: Not on file   Number of children: 3   Years of education: Not on file   Highest education level: Not on file  Occupational History   Not on file  Tobacco Use   Smoking status: Never   Smokeless tobacco: Never  Vaping Use   Vaping Use: Never used  Substance and Sexual Activity   Alcohol use: No    Alcohol/week: 0.0 standard drinks   Drug use: No   Sexual activity: Not Currently  Other Topics Concern   Not on file  Social History Narrative   Not on file   Social Determinants of Health   Financial Resource Strain: Not on file  Food Insecurity: Not on file  Transportation Needs: Not on file  Physical Activity: Not on file  Stress: Not on file  Social Connections: Not on file  Intimate Partner Violence: Not on file    Outpatient Medications Prior to Visit  Medication Sig Dispense Refill   Calcium Carbonate (CALCIUM 600 PO) Take 1 tablet 2 (two) times daily by mouth.     cholecalciferol (VITAMIN D3) 25 MCG (1000 UT) tablet Take 2,000 Units by mouth daily.     hydrochlorothiazide (HYDRODIURIL) 25  MG tablet Take 1 tablet (25 mg total) by mouth daily. 90 tablet 3   levothyroxine (SYNTHROID) 50 MCG tablet Take 1 tablet (50 mcg total) by mouth daily. 90 tablet 3   Multiple Vitamin (MULTIVITAMIN) capsule Take 1 capsule daily by mouth.     potassium chloride (KLOR-CON) 10 MEQ tablet TAKE 1 TABLET BY MOUTH  DAILY 90 tablet 3   No facility-administered medications prior to visit.    No Known Allergies  ROS Review of Systems  Constitutional: Negative.   HENT: Negative.    Eyes: Negative.   Respiratory: Negative.    Cardiovascular: Negative.   Gastrointestinal: Negative.   Genitourinary: Negative.   Musculoskeletal: Negative.   Skin: Negative.   Neurological: Negative.   Psychiatric/Behavioral: Negative.       Objective:    Physical Exam Vitals and nursing note reviewed.  Constitutional:      General: She is not in acute distress.     Appearance: Normal appearance. She is normal weight. She is not ill-appearing, toxic-appearing or diaphoretic.  Cardiovascular:     Rate and Rhythm: Normal rate and regular rhythm.     Heart sounds: Normal heart sounds. No murmur heard.   No friction rub. No gallop.  Pulmonary:     Effort: Pulmonary effort is normal. No respiratory distress.     Breath sounds: Normal breath sounds. No stridor. No wheezing, rhonchi or rales.  Chest:     Chest wall: No tenderness.  Skin:    General: Skin is warm and dry.  Neurological:     General: No focal deficit present.     Mental Status: She is alert and oriented to person, place, and time. Mental status is at baseline.  Psychiatric:        Mood and Affect: Mood normal.        Behavior: Behavior normal.        Thought Content: Thought content normal.        Judgment: Judgment normal.    BP 129/66    Pulse 67    Temp 98 F (36.7 C) (Temporal)    Resp 18    Ht 5\' 4"  (1.626 m)    Wt 134 lb 3.2 oz (60.9 kg)    SpO2 99%    BMI 23.04 kg/m  Wt Readings from Last 3 Encounters:  12/04/21 134 lb 3.2 oz (60.9 kg)  09/13/21 132 lb 6.4 oz (60.1 kg)  06/01/21 126 lb 4 oz (57.3 kg)     Health Maintenance Due  Topic Date Due   Hepatitis C Screening  Never done   MAMMOGRAM  Never done   COVID-19 Vaccine (5 - Booster for [place] series) 11/25/2020    There are no preventive care reminders to display for this patient.  Lab Results  Component Value Date   TSH 7.36 (H) 09/13/2021   Lab Results  Component Value Date   WBC 8.7 09/13/2021   HGB 13.6 09/13/2021   HCT 40.8 09/13/2021   MCV 86.6 09/13/2021   PLT 367.0 09/13/2021   Lab Results  Component Value Date   NA 140 09/13/2021   K 3.2 (L) 09/13/2021   CO2 26 09/13/2021   GLUCOSE 73 09/13/2021   BUN 18 09/13/2021   CREATININE 0.86 09/13/2021   BILITOT 1.3 (H) 09/13/2021   ALKPHOS 82 09/13/2021   AST 23 09/13/2021   ALT 20 09/13/2021   PROT 7.6 09/13/2021   ALBUMIN 4.6 09/13/2021    CALCIUM 11.0 (H) 09/13/2021   ANIONGAP  12 04/14/2021   GFR 66.52 09/13/2021   Lab Results  Component Value Date   CHOL 310 (H) 09/13/2021   Lab Results  Component Value Date   HDL 80.80 09/13/2021   Lab Results  Component Value Date   LDLCALC 209 (H) 09/13/2021   Lab Results  Component Value Date   TRIG 101.0 09/13/2021   Lab Results  Component Value Date   CHOLHDL 4 09/13/2021   Lab Results  Component Value Date   HGBA1C 5.7 09/13/2021      Assessment & Plan:   Problem List Items Addressed This Visit   None Visit Diagnoses     LBBB (left bundle branch block)    -  Primary   DOE (dyspnea on exertion)           No orders of the defined types were placed in this encounter.   Follow-up: Return in about 4 months (around 04/04/2022) for thyroid.   PLAN Continue to follow with Dr. Radford Pax. Reviewed r/b/se of holding off on synthroid at this time - her last TSH was 7.3, so I do not think it unreasonable to complete cardiology work up before starting synthroid, but did review with her that she would likely see benefit in bradycardia from taking synthroid Spent time reviewing past EKG and pathophysiology of lbbb and purpose of diagnostic screening with patient. Patient encouraged to call clinic with any questions, comments, or concerns.  I spent 52 minutes with this patient educating, reviewing labs and ekg, reviewing specialist notes, and planning follow up.   Maximiano Coss, NP

## 2022-02-26 ENCOUNTER — Ambulatory Visit (HOSPITAL_BASED_OUTPATIENT_CLINIC_OR_DEPARTMENT_OTHER): Payer: Medicare Other | Admitting: Internal Medicine

## 2022-04-04 ENCOUNTER — Ambulatory Visit: Payer: Medicare Other | Admitting: Registered Nurse

## 2022-04-05 ENCOUNTER — Other Ambulatory Visit: Payer: Self-pay

## 2022-04-05 ENCOUNTER — Ambulatory Visit: Payer: Medicare Other | Admitting: Registered Nurse

## 2022-04-05 ENCOUNTER — Encounter: Payer: Self-pay | Admitting: Registered Nurse

## 2022-04-05 ENCOUNTER — Ambulatory Visit (INDEPENDENT_AMBULATORY_CARE_PROVIDER_SITE_OTHER): Payer: Medicare Other | Admitting: Registered Nurse

## 2022-04-05 VITALS — BP 144/88 | HR 80 | Temp 98.1°F | Resp 18 | Ht 64.0 in | Wt 139.4 lb

## 2022-04-05 DIAGNOSIS — E785 Hyperlipidemia, unspecified: Secondary | ICD-10-CM | POA: Diagnosis not present

## 2022-04-05 DIAGNOSIS — E039 Hypothyroidism, unspecified: Secondary | ICD-10-CM | POA: Diagnosis not present

## 2022-04-05 DIAGNOSIS — Z1159 Encounter for screening for other viral diseases: Secondary | ICD-10-CM | POA: Diagnosis not present

## 2022-04-05 LAB — LIPID PANEL
Cholesterol: 313 mg/dL — ABNORMAL HIGH (ref 0–200)
HDL: 78.5 mg/dL (ref >39.00)
LDL Cholesterol: 223 mg/dL — ABNORMAL HIGH (ref 0–99)
NonHDL: 234.72
Total CHOL/HDL Ratio: 4
Triglycerides: 60 mg/dL (ref 0.0–149.0)
VLDL: 12 mg/dL (ref 0.0–40.0)

## 2022-04-05 LAB — TSH: TSH: 0.66 u[IU]/mL (ref 0.35–5.50)

## 2022-04-05 LAB — T4, FREE: Free T4: 1 ng/dL (ref 0.60–1.60)

## 2022-04-05 NOTE — Progress Notes (Signed)
? ?Established Patient Office Visit ? ?Subjective:  ?Patient ID: Erin Gay, female    DOB: 1946-07-30  Age: 76 y.o. MRN: [id number] ? ?CC:  ?Chief Complaint  ?Patient presents with  ? Follow-up  ?  Patient states she is here for follow up on thyroid. Patient has no other concerns.  ? ? ?HPI ?Erin Gay presents for thyroid ? ?Hypothyroidism ?On synthroid 11mg po qd ?Good effect, no AE ? ?Lab Results  ?Component Value Date  ? TSH 7.36 (H) 09/13/2021  ? ?Due for hep C screen for HM ?Ok to get today.  ? ?Outpatient Medications Prior to Visit  ?Medication Sig Dispense Refill  ? Calcium Carbonate (CALCIUM 600 PO) Take 1 tablet 2 (two) times daily by mouth.    ? cholecalciferol (VITAMIN D3) 25 MCG (1000 UT) tablet Take 2,000 Units by mouth daily.    ? hydrochlorothiazide (HYDRODIURIL) 25 MG tablet Take 1 tablet (25 mg total) by mouth daily. 90 tablet 3  ? levothyroxine (SYNTHROID) 50 MCG tablet Take 1 tablet (50 mcg total) by mouth daily. 90 tablet 3  ? Multiple Vitamin (MULTIVITAMIN) capsule Take 1 capsule daily by mouth.    ? potassium chloride (KLOR-CON) 10 MEQ tablet TAKE 1 TABLET BY MOUTH  DAILY 90 tablet 3  ? ?No facility-administered medications prior to visit.  ? ? ?Review of Systems  ?Constitutional: Negative.   ?HENT: Negative.    ?Eyes: Negative.   ?Respiratory: Negative.    ?Cardiovascular: Negative.   ?Gastrointestinal: Negative.   ?Genitourinary: Negative.   ?Musculoskeletal: Negative.   ?Skin: Negative.   ?Neurological: Negative.   ?Psychiatric/Behavioral: Negative.    ?All other systems reviewed and are negative. ? ?  ?Objective:  ?  ? ?BP (!) 144/88   Pulse 80   Temp 98.1 ?F (36.7 ?C) (Temporal)   Resp 18   Ht '5\' 4"'$  (1.626 m)   Wt 139 lb 6.4 oz (63.2 kg)   SpO2 99%   BMI 23.93 kg/m?  ? ?Wt Readings from Last 3 Encounters:  ?04/05/22 139 lb 6.4 oz (63.2 kg)  ?12/04/21 134 lb 3.2 oz (60.9 kg)  ?09/13/21 132 lb 6.4 oz (60.1 kg)  ? ?Physical Exam ?Vitals and nursing note reviewed.   ?Constitutional:   ?   General: She is not in acute distress. ?   Appearance: Normal appearance. She is normal weight. She is not ill-appearing, toxic-appearing or diaphoretic.  ?Cardiovascular:  ?   Rate and Rhythm: Normal rate and regular rhythm.  ?   Heart sounds: Normal heart sounds. No murmur heard. ?  No friction rub. No gallop.  ?Pulmonary:  ?   Effort: Pulmonary effort is normal. No respiratory distress.  ?   Breath sounds: Normal breath sounds. No stridor. No wheezing, rhonchi or rales.  ?Chest:  ?   Chest wall: No tenderness.  ?Musculoskeletal:  ?   Cervical back: Normal range of motion. No rigidity.  ?Lymphadenopathy:  ?   Cervical: No cervical adenopathy.  ?Skin: ?   General: Skin is warm and dry.  ?Neurological:  ?   General: No focal deficit present.  ?   Mental Status: She is alert and oriented to person, place, and time. Mental status is at baseline.  ?Psychiatric:     ?   Mood and Affect: Mood normal.     ?   Behavior: Behavior normal.     ?   Thought Content: Thought content normal.     ?   Judgment:  Judgment normal.  ? ? ?No results found for any visits on 04/05/22. ? ? ? ?The 10-year ASCVD risk score (Arnett DK, et al., 2019) is: 27% ? ?  ?Assessment & Plan:  ? ?Problem List Items Addressed This Visit   ? ?  ? Endocrine  ? Hypothyroidism - Primary  ?  Stable clinically.  ?Will recheck labs and adjust dose as indicated. ? ?  ?  ? Relevant Orders  ? TSH  ? T4, free  ?  ? Other  ? Hyperlipidemia  ?  Recheck today.  ? ?  ?  ? Relevant Orders  ? Lipid panel  ? ?Other Visit Diagnoses   ? ? Encounter for screening for other viral diseases      ? Relevant Orders  ? Hepatitis C antibody  ? ?  ? ? ?No orders of the defined types were placed in this encounter. ? ? ?Return in about 6 months (around 10/05/2022) for Chronic Conditions.  ? ? ?Maximiano Coss, NP ?

## 2022-04-05 NOTE — Assessment & Plan Note (Signed)
Recheck today. 

## 2022-04-05 NOTE — Patient Instructions (Addendum)
Ms. Rockholt - ? ?Great to see you ? ?Let's check labs ? ?See you in Oct if all is stable ? ?Thanks, ? ?Rich  ? ? ? ?If you have lab work done today you will be contacted with your lab results within the next 2 weeks.  If you have not heard from [place] then please contact us. The fastest way to get your results is to register for My Chart. ? ? ?IF you received an x-ray today, you will receive an invoice from [facility]. Please contact [facility] at [phone] with questions or concerns regarding your invoice.  ? ?IF you received labwork today, you will receive an invoice from [facility]. Please contact [facility] at [phone] with questions or concerns regarding your invoice.  ? ?Our billing staff will not be able to assist you with questions regarding bills from these companies. ? ?You will be contacted with the lab results as soon as they are available. The fastest way to get your results is to activate your My Chart account. Instructions are located on the last page of this paperwork. If you have not heard from [place] regarding the results in 2 weeks, please contact this office. ?  ?  ?

## 2022-04-05 NOTE — Assessment & Plan Note (Signed)
Stable clinically.  ?Will recheck labs and adjust dose as indicated. ?

## 2022-04-06 ENCOUNTER — Other Ambulatory Visit: Payer: Self-pay | Admitting: Registered Nurse

## 2022-04-06 DIAGNOSIS — E039 Hypothyroidism, unspecified: Secondary | ICD-10-CM

## 2022-04-06 MED ORDER — LEVOTHYROXINE SODIUM 50 MCG PO TABS: 50.0000 ug | ORAL_TABLET | Freq: Every day | ORAL | 3 refills | Status: DC

## 2022-04-08 LAB — HEPATITIS C ANTIBODY
Hepatitis C Ab: NONREACTIVE
SIGNAL TO CUT-OFF: 0.16 (ref <1.00)

## 2022-04-27 ENCOUNTER — Other Ambulatory Visit: Payer: Self-pay | Admitting: Registered Nurse

## 2022-04-27 DIAGNOSIS — I10 Essential (primary) hypertension: Secondary | ICD-10-CM

## 2022-05-14 ENCOUNTER — Other Ambulatory Visit: Payer: Self-pay | Admitting: Registered Nurse

## 2022-05-14 ENCOUNTER — Encounter: Payer: Self-pay | Admitting: Registered Nurse

## 2022-05-17 NOTE — Telephone Encounter (Signed)
Pt responded to question about condition

## 2022-05-27 LAB — HM MAMMOGRAPHY

## 2022-05-28 ENCOUNTER — Ambulatory Visit (INDEPENDENT_AMBULATORY_CARE_PROVIDER_SITE_OTHER): Payer: Medicare Other | Admitting: Family Medicine

## 2022-05-28 ENCOUNTER — Encounter: Payer: Self-pay | Admitting: Family Medicine

## 2022-05-28 VITALS — BP 122/80 | HR 73 | Temp 98.1°F | Resp 16 | Ht 64.0 in | Wt 140.2 lb

## 2022-05-28 DIAGNOSIS — S6991XA Unspecified injury of right wrist, hand and finger(s), initial encounter: Secondary | ICD-10-CM

## 2022-05-28 MED ORDER — DICLOFENAC SODIUM 1 % EX GEL: 2.0000 g | Freq: Four times a day (QID) | CUTANEOUS | 1 refills | Status: DC

## 2022-05-28 NOTE — Patient Instructions (Signed)
Follow up as needed or as scheduled We'll call you with your appt for the 2nd opinion Apply the Voltaren gel directly to the impacted area up to 4x/day ICE!! Call with any questions or concerns Hang in there!!!

## 2022-05-28 NOTE — Progress Notes (Signed)
   Subjective:    Patient ID: Erin Gay, female    DOB: 1946/11/14, 76 y.o.   MRN: [id number]  HPI R wrist injury- pt reports she injured wrist 7.5 weeks ago when cell phone fell off back of sofa and hit her.  Has been seeing Dr Grandville Silos at [place].  Has asked for xrays but these have not been ordered.  Was tx'd w/ Prednisone and steroid injection 2 weeks ago w/o relief.  Pt reports area is still very painful, swollen.  Pt was told she has a tendon injury and was given stretches to do for this.  Still has limited ROM.  Pt is R handed.  Was advised to go to PT but based on a previous bad experience (w/ her knee) she did not go   Review of Systems For ROS see HPI     Objective:   Physical Exam Vitals reviewed.  Constitutional:      General: She is not in acute distress.    Appearance: Normal appearance. She is not ill-appearing.  Cardiovascular:     Pulses: Normal pulses.  Musculoskeletal:        General: Swelling (mild swelling over R CMC joint and distal radius) and tenderness present. No deformity.     Comments: Limited ROM of R wrist  Skin:    General: Skin is warm and dry.  Neurological:     General: No focal deficit present.     Mental Status: She is alert and oriented to person, place, and time.           Assessment & Plan:  R wrist pain- new to provider, ongoing for pt.  Pt sustained an injury 7.5 weeks ago when cell phone fell on her from back of couch.  She has not had xrays but has been seeing Ortho hand specialist (Dr Grandville Silos).  She still has swelling, pain, and decreased ROM of her dominant hand.  Did not do PT as recommended.  Will refer to Dr Veronia Beets for a 2nd opinion.  Start Voltaren gel and ice.  Pt expressed understanding and is in agreement w/ plan.

## 2022-05-29 ENCOUNTER — Telehealth: Payer: Self-pay | Admitting: Registered Nurse

## 2022-05-29 DIAGNOSIS — S6991XA Unspecified injury of right wrist, hand and finger(s), initial encounter: Secondary | ICD-10-CM

## 2022-05-29 NOTE — Telephone Encounter (Signed)
(  Richard PT) PT seen Dr. Birdie Riddle yesterday. PT stats [facility] can't see her until June 29th. PT wants to switch her referral over to [place].

## 2022-05-29 NOTE — Telephone Encounter (Signed)
Referral changed at pt's request

## 2022-06-03 ENCOUNTER — Other Ambulatory Visit: Payer: Self-pay | Admitting: Obstetrics and Gynecology

## 2022-06-03 DIAGNOSIS — M8580 Other specified disorders of bone density and structure, unspecified site: Secondary | ICD-10-CM

## 2022-06-03 DIAGNOSIS — E2839 Other primary ovarian failure: Secondary | ICD-10-CM

## 2022-08-08 ENCOUNTER — Other Ambulatory Visit: Payer: Self-pay

## 2022-08-08 DIAGNOSIS — E876 Hypokalemia: Secondary | ICD-10-CM

## 2022-08-08 MED ORDER — POTASSIUM CHLORIDE ER 10 MEQ PO TBCR: 10.0000 meq | EXTENDED_RELEASE_TABLET | Freq: Every day | ORAL | 1 refills | Status: DC

## 2022-09-24 ENCOUNTER — Ambulatory Visit: Payer: Medicare Other | Admitting: Registered Nurse

## 2022-10-12 ENCOUNTER — Other Ambulatory Visit: Payer: Self-pay | Admitting: Family Medicine

## 2022-10-12 DIAGNOSIS — E876 Hypokalemia: Secondary | ICD-10-CM

## 2022-11-02 IMAGING — CT CT RENAL STONE PROTOCOL
2 of 4 series · 15 of 46 positions shown, 17 images · non-contrast
Comparison: None.

CLINICAL DATA: Flank pain.

EXAM:
CT ABDOMEN AND PELVIS WITHOUT CONTRAST
TECHNIQUE: Multidetector CT imaging of the abdomen and pelvis was performed
following the standard protocol without IV contrast.

[Series 2: axial st · axial · 0.84mm/px · z∈[-366,-21]mm · 12 of 83 slices shown, 14 images]
[im 7/83  soft-tissue]
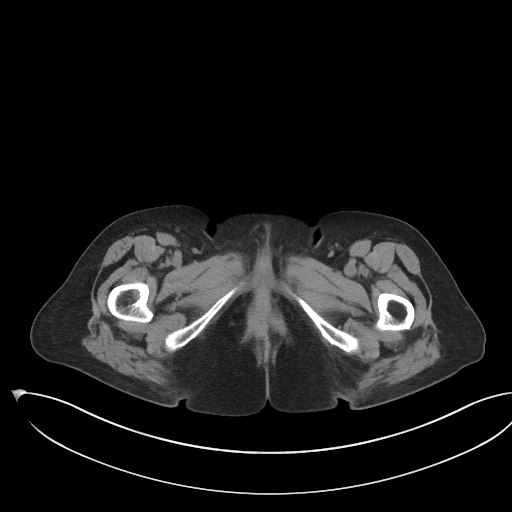
[im 7/83  bone]
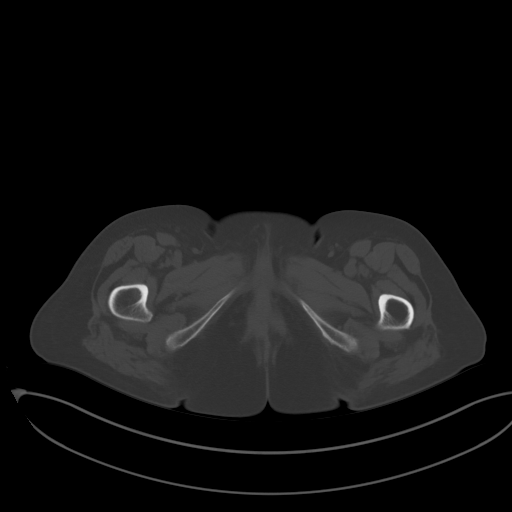
[im 13/83  soft-tissue]
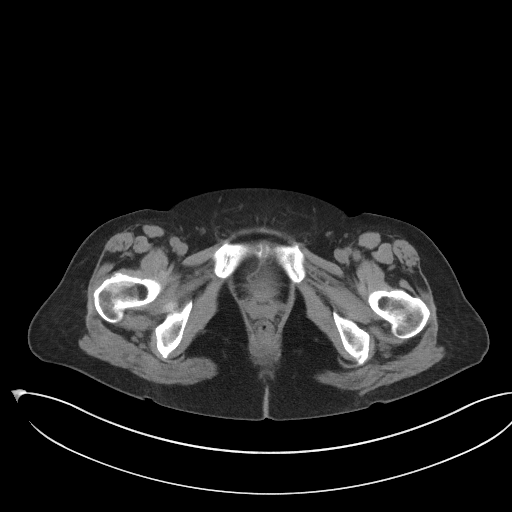
[im 19/83  soft-tissue]
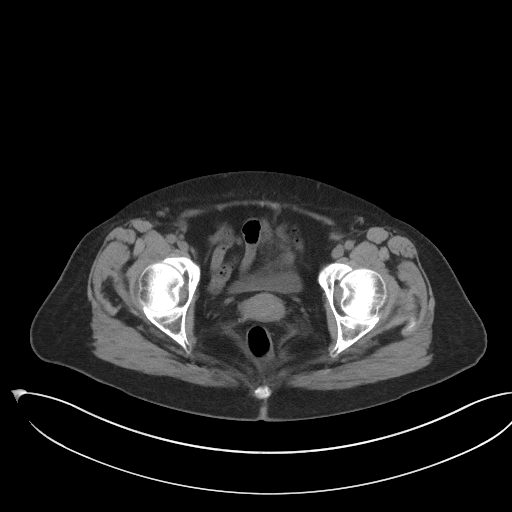
[im 25/83  soft-tissue]
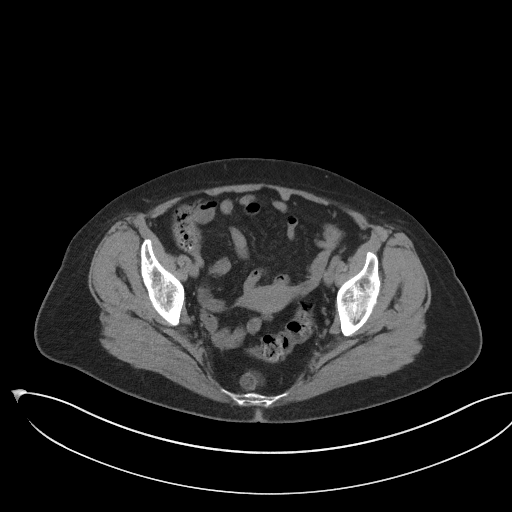
[im 31/83  soft-tissue]
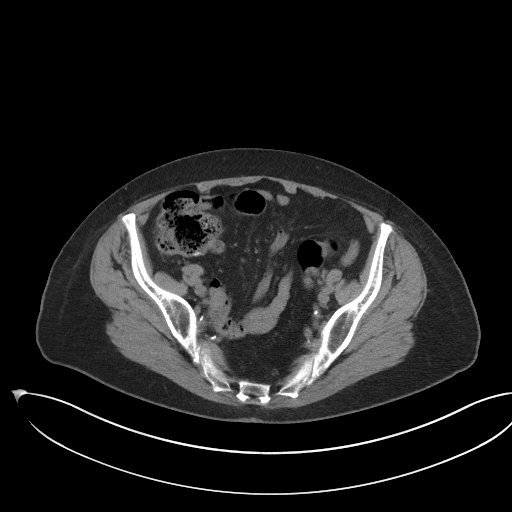
[im 37/83  soft-tissue]
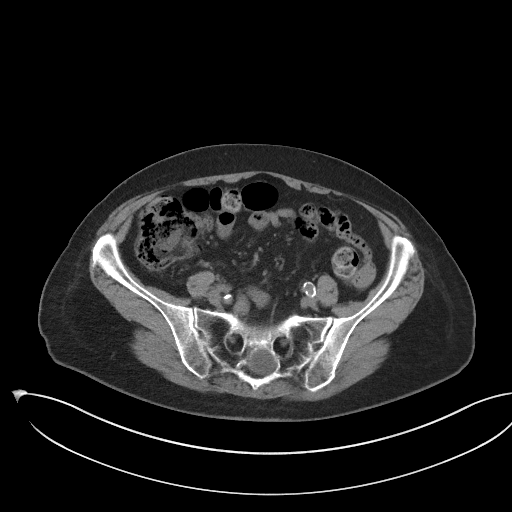
[im 46/83  soft-tissue]
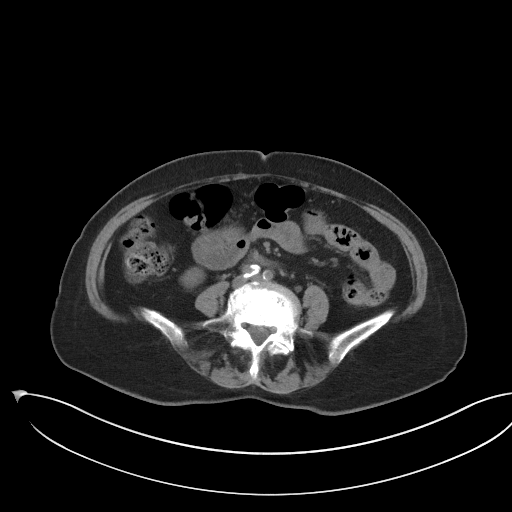
[im 52/83  soft-tissue]
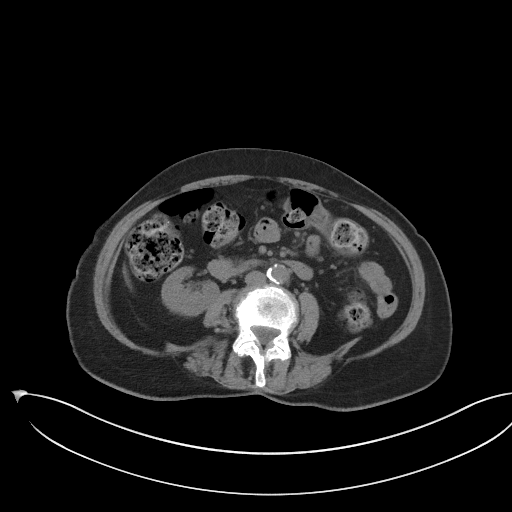
[im 58/83  soft-tissue]
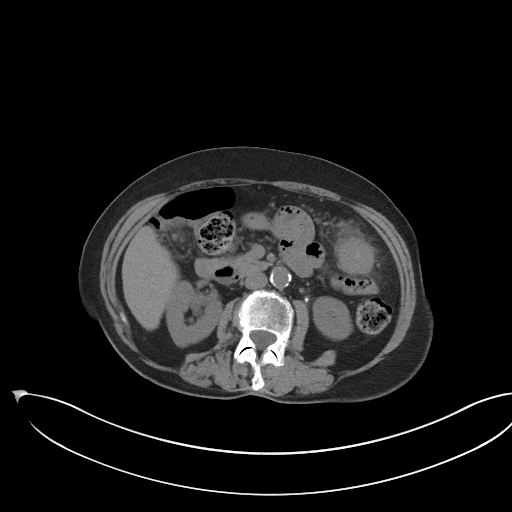
[im 58/83  bone]
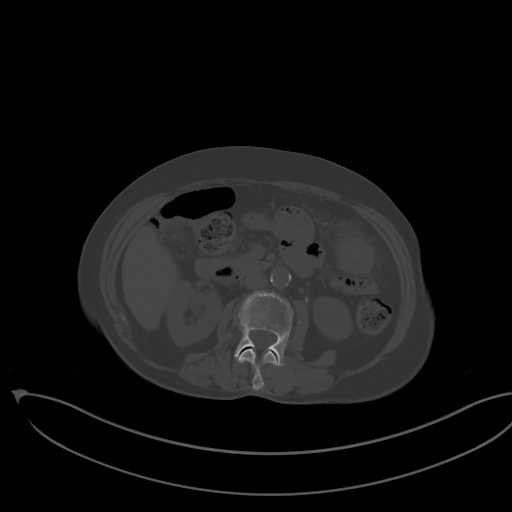
[im 64/83  soft-tissue]
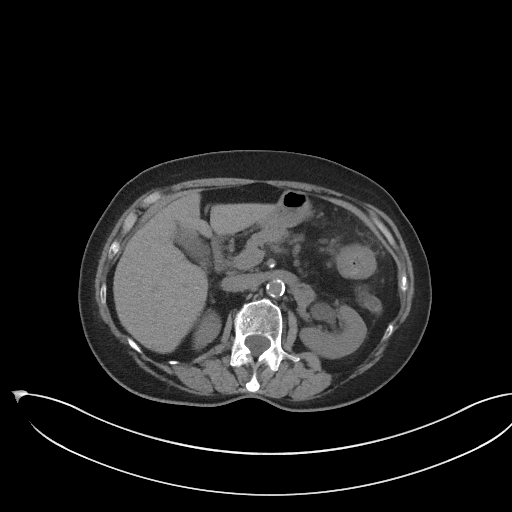
[im 70/83  soft-tissue]
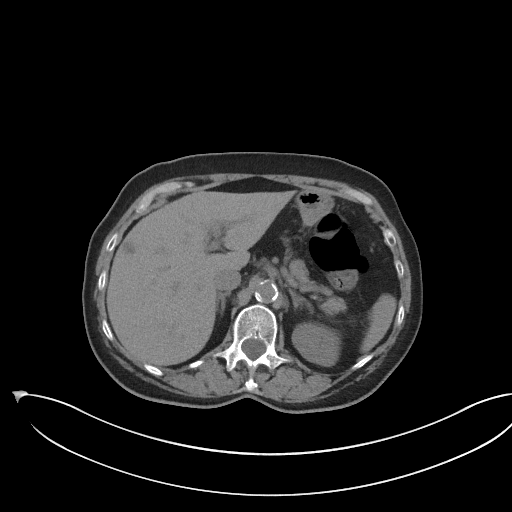
[im 76/83  soft-tissue]
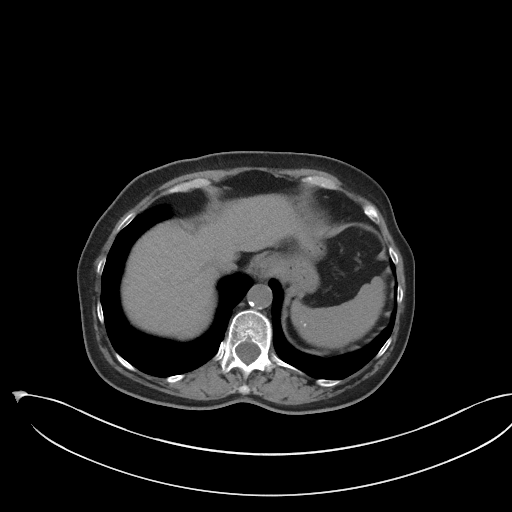

[Series 5: coronal st · coronal · 0.75mm/px · 3 of 79 slices shown]
[im 27/79  soft-tissue]
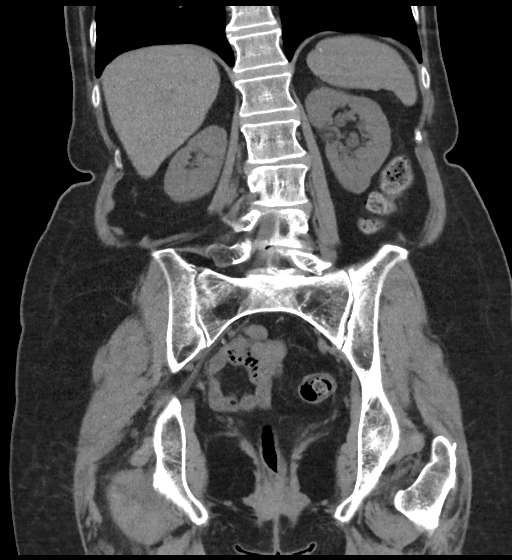
[im 35/79  soft-tissue]
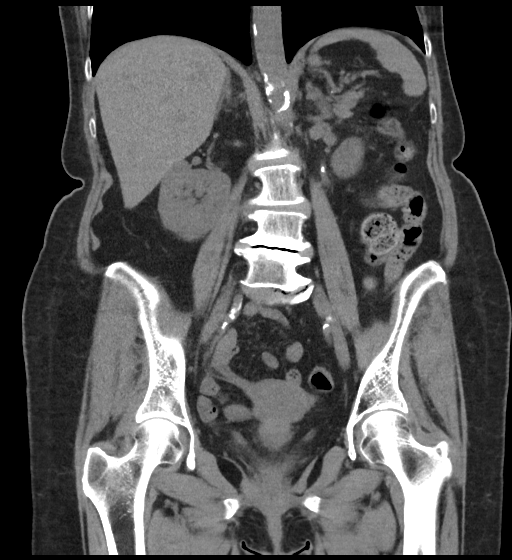
[im 44/79  soft-tissue]
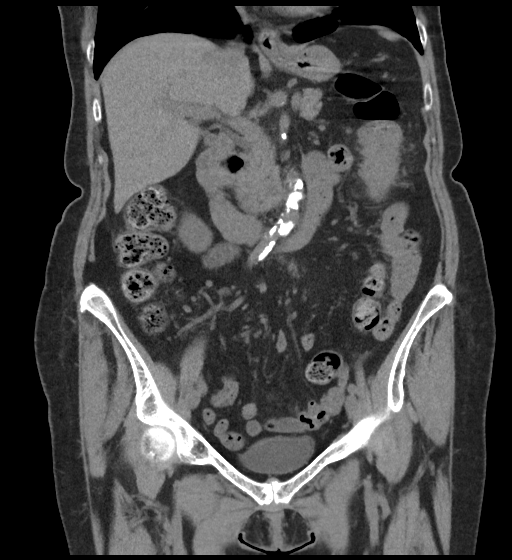

[15 of 46 positions shown; findings below may reference images not displayed]

FINDINGS: Lower chest: Unremarkable.

Hepatobiliary: 12 mm low-density ill-defined lesion noted in the
anterior right liver (image 13/series 2. There is no evidence for
gallstones, gallbladder wall thickening, or pericholecystic fluid.
No intrahepatic or extrahepatic biliary dilation.

Pancreas: No focal mass lesion. No dilatation of the main duct. No
intraparenchymal cyst. No peripancreatic edema.

Spleen: No splenomegaly. No focal mass lesion.

Adrenals/Urinary Tract: No adrenal nodule or mass. Duplicated right
intrarenal collecting system noted with otherwise unremarkable right
kidney. Possible at least partial duplication of the right ureter.
There is mild fullness of the left intrarenal collecting system with
3 x 3 x 5 mm stone at or just distal to the left UPJ. Left ureter
unremarkable. The urinary bladder appears normal for the degree of
distention.

Stomach/Bowel: Stomach is unremarkable. No gastric wall thickening.
No evidence of outlet obstruction. Duodenum is normally positioned
as is the ligament of Treitz. Duodenal diverticulum noted. No small
bowel wall thickening. No small bowel dilatation. The terminal ileum
is normal. The appendix is not well visualized, but there is no
edema or inflammation in the region of the cecum. Focal marked
irregular circumferential wall thickening is identified in the
distal transverse colon. Lesion measures approximately 3-4 cm in
length. Multiple adjacent small lymph nodes and edema evident in the
pericolonic fat. Although there is substantial luminal narrowing,
stool is visible in the left colon distal to this lesion.

Vascular/Lymphatic: There is abdominal aortic atherosclerosis
without aneurysm. There is no gastrohepatic or hepatoduodenal
ligament lymphadenopathy. No retroperitoneal or mesenteric
lymphadenopathy. No pelvic sidewall lymphadenopathy.

Reproductive: The uterus is unremarkable.  There is no adnexal mass.

Other: No intraperitoneal free fluid.

Musculoskeletal: No worrisome lytic or sclerotic osseous
abnormality.
IMPRESSION: 1. 3-4 cm segment of marked irregular circumferential wall
thickening in the mid transverse colon with adjacent small lymph
nodes and edema in the pericolonic fat. Imaging features are highly
suspicious for neoplasm. Transmural extension and metastatic
adjacent lymphadenopathy are concerns.
2. 12 mm ill-defined low-density lesion in the anterior right liver.
Metastatic disease not excluded. MRI of the abdomen without and with
contrast or PET-CT may prove helpful to further evaluate.
3. 3 x 3 x 5 mm stone at or just distal to the left UPJ with mild
fullness of the left intrarenal collecting system.
4. Duplicated right intrarenal collecting system.
5. Aortic Atherosclerosis ([id number]).

## 2022-12-05 ENCOUNTER — Ambulatory Visit
Admission: RE | Admit: 2022-12-05 | Discharge: 2022-12-05 | Disposition: A | Discharge status: Home / Self Care | Payer: Medicare Other | Source: Ambulatory Visit | Attending: Obstetrics and Gynecology | Admitting: Obstetrics and Gynecology

## 2022-12-05 DIAGNOSIS — E2839 Other primary ovarian failure: Secondary | ICD-10-CM

## 2022-12-05 DIAGNOSIS — M8580 Other specified disorders of bone density and structure, unspecified site: Secondary | ICD-10-CM

## 2022-12-29 ENCOUNTER — Other Ambulatory Visit: Payer: Self-pay | Admitting: Family Medicine

## 2022-12-29 DIAGNOSIS — E876 Hypokalemia: Secondary | ICD-10-CM

## 2023-01-10 ENCOUNTER — Other Ambulatory Visit: Payer: Self-pay

## 2023-01-10 ENCOUNTER — Telehealth: Payer: Self-pay | Admitting: Registered Nurse

## 2023-01-10 DIAGNOSIS — I10 Essential (primary) hypertension: Secondary | ICD-10-CM

## 2023-01-10 MED ORDER — HYDROCHLOROTHIAZIDE 25 MG PO TABS: 25.0000 mg | ORAL_TABLET | Freq: Every day | ORAL | 0 refills | Status: DC

## 2023-01-10 NOTE — Telephone Encounter (Signed)
Encourage patient to contact the pharmacy for refills or they can request refills through [facility]  (Please schedule appointment if patient has not been seen in over a year)    WHAT PHARMACY WOULD THEY LIKE THIS SENT TO: [place], [place] (HYDRODIURIL) 25 MG tablet   NOTES/COMMENTS FROM PATIENT: She has an appt with Di Kindle 01/30/23     Front office please notify patient: It takes 48-72 hours to process rx refill requests Ask patient to call pharmacy to ensure rx is ready before heading there.

## 2023-01-10 NOTE — Telephone Encounter (Signed)
Rx sent in

## 2023-01-24 ENCOUNTER — Other Ambulatory Visit: Payer: Self-pay | Admitting: Family Medicine

## 2023-01-24 DIAGNOSIS — I10 Essential (primary) hypertension: Secondary | ICD-10-CM

## 2023-01-30 ENCOUNTER — Ambulatory Visit: Payer: Medicare Other | Admitting: Family Medicine

## 2023-03-03 ENCOUNTER — Other Ambulatory Visit: Payer: Self-pay

## 2023-03-03 DIAGNOSIS — E039 Hypothyroidism, unspecified: Secondary | ICD-10-CM

## 2023-03-11 ENCOUNTER — Encounter: Payer: Self-pay | Admitting: Family Medicine

## 2023-03-11 ENCOUNTER — Ambulatory Visit (INDEPENDENT_AMBULATORY_CARE_PROVIDER_SITE_OTHER): Payer: Medicare Other | Admitting: Family Medicine

## 2023-03-11 VITALS — BP 130/72 | HR 71 | Temp 97.7°F | Resp 18 | Ht 64.0 in | Wt 131.8 lb

## 2023-03-11 DIAGNOSIS — I10 Essential (primary) hypertension: Secondary | ICD-10-CM | POA: Diagnosis not present

## 2023-03-11 DIAGNOSIS — Z7689 Persons encountering health services in other specified circumstances: Secondary | ICD-10-CM

## 2023-03-11 DIAGNOSIS — C185 Malignant neoplasm of splenic flexure: Secondary | ICD-10-CM | POA: Diagnosis not present

## 2023-03-11 DIAGNOSIS — E785 Hyperlipidemia, unspecified: Secondary | ICD-10-CM

## 2023-03-11 DIAGNOSIS — E039 Hypothyroidism, unspecified: Secondary | ICD-10-CM | POA: Diagnosis not present

## 2023-03-11 DIAGNOSIS — R21 Rash and other nonspecific skin eruption: Secondary | ICD-10-CM

## 2023-03-11 DIAGNOSIS — Z789 Other specified health status: Secondary | ICD-10-CM

## 2023-03-11 NOTE — Progress Notes (Signed)
New Patient Office Visit  Subjective    Patient ID: Cidnee Nishiyama, female    DOB: 1946-03-30  Age: 77 y.o. MRN: [id number]  CC:  Chief Complaint  Patient presents with   Establish Care    Patient states that  she would like to know if she could be tested for lupus, She states that it has come to her attention that at least once a month she develops what she believes and describes as being a " Butterfly Rash" on her face    HPI Marianna Mcgahan presents to establish care. Pt is new to me.  Pt had hx of kidney stone in 2022 and incidentally found she had colon cancer. Has had colectomy.  She didn't have to have any chemo or radiation. She has had annual colonoscopies and ct scan.  She is taking calcium and vitamin daily.  She has GERD and has Famotidine 20mg .  She has hypothyroidism and taking Synthroid 80mcg daily.  She has HTN and using HCTZ 25 mg daily with KCL 10 meq daily.  Pt has hx of HLD and is unable to tolerate statins. Fasting today  Pt reports her daughter has mentioned that pt may have lupus. She reports her face gets really red, around her cheeks. Started about 6 months ago. Not everyday. Feels warm to touch, about once a month. She would sometimes wake up with it. Not itching. She denies arthralgias. She has hx of trigger finger of the left 5th finger. She's had injections in this in the past.    Outpatient Encounter Medications as of 03/11/2023  Medication Sig   Calcium Carbonate (CALCIUM 600 PO) Take 1 tablet 2 (two) times daily by mouth.   cholecalciferol (VITAMIN D3) 25 MCG (1000 UT) tablet Take 2,000 Units by mouth daily.   famotidine (PEPCID) 20 MG tablet Take 20 mg by mouth daily.   hydrochlorothiazide (HYDRODIURIL) 25 MG tablet TAKE 1 TABLET BY MOUTH DAILY   levothyroxine (SYNTHROID) 50 MCG tablet Take 1 tablet (50 mcg total) by mouth daily.   Multiple Vitamin (MULTIVITAMIN) capsule Take 1 capsule daily by mouth.   Multiple Vitamins-Minerals  (PRESERVISION AREDS 2) CAPS Take by mouth.   potassium chloride (KLOR-CON) 10 MEQ tablet TAKE 1 TABLET BY MOUTH DAILY   [DISCONTINUED] diclofenac Sodium (VOLTAREN) 1 % GEL Apply 2 g topically 4 (four) times daily.   [DISCONTINUED] fluvastatin (LESCOL) 40 MG capsule Take 1 capsule (40 mg total) by mouth every other day.   No facility-administered encounter medications on file as of 03/11/2023.    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   thyroid nodule removed  35 years ago   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 per pt   TONSILLECTOMY  76 years old    Family History  Problem Relation Age of Onset   Cancer Mother        pt unsure, colon resection done   Heart disease Mother    Hyperlipidemia Mother    Colon polyps Mother    Heart disease Father    Heart disease Brother    Diabetes  Brother        t2dm    Social History   Socioeconomic History   Marital status: Married    Spouse name: Not on file   Number of children: 3   Years of education: Not on file   Highest education level: Not on file  Occupational History   Not on file  Tobacco Use   Smoking status: Never    Passive exposure: Never   Smokeless tobacco: Never  Vaping Use   Vaping Use: Never used  Substance and Sexual Activity   Alcohol use: No    Alcohol/week: 0.0 standard drinks of alcohol   Drug use: No   Sexual activity: Not Currently  Other Topics Concern   Not on file  Social History Narrative   Not on file   Social Determinants of Health   Financial Resource Strain: Low Risk  (05/18/2019)   Overall Financial Resource Strain (CARDIA)    Difficulty of Paying Living Expenses: Not hard at all  Food  Insecurity: No Food Insecurity (05/18/2019)   Hunger Vital Sign    Worried About Running Out of Food in the Last Year: Never true    Ran Out of Food in the Last Year: Never true  Transportation Needs: No Transportation Needs (05/18/2019)   PRAPARE - Hydrologist (Medical): No    Lack of Transportation (Non-Medical): No  Physical Activity: Insufficiently Active (05/18/2019)   Exercise Vital Sign    Days of Exercise per Week: 3 days    Minutes of Exercise per Session: 30 min  Stress: No Stress Concern Present (05/18/2019)   [place]    Feeling of Stress : Not at all  Social Connections: Moderately Integrated (05/18/2019)   Social Connection and Isolation Panel [NHANES]    Frequency of Communication with Friends and Family: More than three times a week    Frequency of Social Gatherings with Friends and Family: Three times a week    Attends Religious Services: More than 4 times per year    Active Member of Clubs or Organizations: No    Attends Archivist Meetings: Never    Marital Status: Married  Human resources officer Violence: Not At Risk (05/18/2019)   Humiliation, Afraid, Rape, and Kick questionnaire    Fear of Current or Ex-Partner: No    Emotionally Abused: No    Physically Abused: No    Sexually Abused: No    Review of Systems  Skin:  Positive for rash.       Facial rash on cheeks  All other systems reviewed and are negative.      Objective    BP 130/72   Pulse 71   Temp 97.7 F (36.5 C) (Oral)   Resp 18   Ht 5\' 4"  (1.626 m)   Wt 131 lb 12.8 oz (59.8 kg)   SpO2 97%   BMI 22.62 kg/m   Physical Exam Vitals and nursing note reviewed.  Constitutional:      Appearance: She is normal weight.  HENT:     Head: Normocephalic and atraumatic.     Right Ear: External ear normal.     Left Ear: External ear normal.     Nose: Nose normal.     Mouth/Throat:     Mouth: Mucous  membranes are moist.  Eyes:     Conjunctiva/sclera: Conjunctivae normal.     Pupils: Pupils are equal, round, and reactive to light.  Cardiovascular:     Rate and Rhythm: Normal rate and regular rhythm.     Pulses: Normal pulses.     Heart sounds: Normal heart sounds.  Pulmonary:     Effort: Pulmonary effort is normal.     Breath sounds: Normal breath sounds.  Abdominal:     General: Abdomen is flat. Bowel sounds are normal.  Skin:    General: Skin is warm.     Capillary Refill: Capillary refill takes less than 2 seconds.     Findings: Rash present.     Comments: Left cheek erythematous  Neurological:     General: No focal deficit present.     Mental Status: She is alert and oriented to person, place, and time. Mental status is at baseline.  Psychiatric:        Mood and Affect: Mood normal.        Behavior: Behavior normal.        Thought Content: Thought content normal.        Judgment: Judgment normal.       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  If normal range, will refill synthroid 52mcg.  Essential hypertension  -stable continue HCTZ and KCL  Statin intolerance -     Lipid panel  -unable to tolerate statins. Has hx of HLD. Will recheck today as pt is fasting.   Hyperlipidemia, unspecified hyperlipidemia type -     Lipid panel  Facial rash -     ANA+ENA+DNA/DS+Scl 70+SjoSSA/B  -due to malar rash, check ANA panel    No follow-ups on file.   Leeanne Rio, MD  Total time spent with patient today 46 minutes. This includes reviewing records, evaluating the patient and coordinating care. Face-to-face time >50%.

## 2023-03-13 ENCOUNTER — Encounter: Payer: Self-pay | Admitting: Family Medicine

## 2023-03-13 LAB — ANA+ENA+DNA/DS+SCL 70+SJOSSA/B
ANA Titer 1: NEGATIVE
ENA RNP Ab: 0.5 AI (ref 0.0–0.9)
ENA SM Ab Ser-aCnc: <0.2 AI (ref 0.0–0.9)
ENA SSA (RO) Ab: 0.4 AI (ref 0.0–0.9)
ENA SSB (LA) Ab: <0.2 AI (ref 0.0–0.9)
Scleroderma (Scl-70) (ENA) Antibody, IgG: <0.2 AI (ref 0.0–0.9)
dsDNA Ab: <1 IU/mL (ref 0–9)

## 2023-03-13 LAB — TSH: TSH: 2.5 u[IU]/mL (ref 0.450–4.500)

## 2023-03-13 LAB — T4, FREE: Free T4: 1.49 ng/dL (ref 0.82–1.77)

## 2023-03-13 LAB — LIPID PANEL
Chol/HDL Ratio: 4.2 ratio (ref 0.0–4.4)
Cholesterol, Total: 315 mg/dL — ABNORMAL HIGH (ref 100–199)
HDL: 75 mg/dL (ref >39)
LDL Chol Calc (NIH): 225 mg/dL — ABNORMAL HIGH (ref 0–99)
Triglycerides: 93 mg/dL (ref 0–149)
VLDL Cholesterol Cal: 15 mg/dL (ref 5–40)

## 2023-06-03 ENCOUNTER — Other Ambulatory Visit: Payer: Self-pay | Admitting: Orthopedic Surgery

## 2023-06-16 DIAGNOSIS — M6281 Muscle weakness (generalized): Secondary | ICD-10-CM | POA: Diagnosis not present

## 2023-06-25 DIAGNOSIS — M6281 Muscle weakness (generalized): Secondary | ICD-10-CM | POA: Diagnosis not present

## 2023-06-26 DIAGNOSIS — Z6823 Body mass index (BMI) 23.0-23.9, adult: Secondary | ICD-10-CM | POA: Diagnosis not present

## 2023-06-26 DIAGNOSIS — L900 Lichen sclerosus et atrophicus: Secondary | ICD-10-CM | POA: Diagnosis not present

## 2023-06-26 DIAGNOSIS — Z1231 Encounter for screening mammogram for malignant neoplasm of breast: Secondary | ICD-10-CM | POA: Diagnosis not present

## 2023-07-02 DIAGNOSIS — M79642 Pain in left hand: Secondary | ICD-10-CM | POA: Diagnosis not present

## 2023-07-07 DIAGNOSIS — M79642 Pain in left hand: Secondary | ICD-10-CM | POA: Diagnosis not present

## 2023-07-14 DIAGNOSIS — M79642 Pain in left hand: Secondary | ICD-10-CM | POA: Diagnosis not present

## 2023-09-12 ENCOUNTER — Ambulatory Visit: Payer: Medicare Other | Admitting: Family Medicine

## 2023-09-12 ENCOUNTER — Telehealth: Payer: Self-pay | Admitting: Family Medicine

## 2023-09-12 ENCOUNTER — Other Ambulatory Visit: Payer: Self-pay

## 2023-09-12 DIAGNOSIS — I10 Essential (primary) hypertension: Secondary | ICD-10-CM

## 2023-09-12 DIAGNOSIS — E039 Hypothyroidism, unspecified: Secondary | ICD-10-CM

## 2023-09-12 MED ORDER — HYDROCHLOROTHIAZIDE 25 MG PO TABS: 25.0000 mg | ORAL_TABLET | Freq: Every day | ORAL | 0 refills | Status: DC

## 2023-09-12 MED ORDER — LEVOTHYROXINE SODIUM 50 MCG PO TABS: 50.0000 ug | ORAL_TABLET | Freq: Every day | ORAL | 0 refills | Status: DC

## 2023-09-12 NOTE — Telephone Encounter (Signed)
Patient called.  She is requesting a refill of Levothyroxine and hydrochlorothiazide.

## 2023-09-12 NOTE — Telephone Encounter (Signed)
Medications have been refilled and sent to [facility] with 90 day supple each

## 2023-09-12 NOTE — Telephone Encounter (Signed)
Patient called back.  She would like scripts sent via mail order to [facility] and 90 day supply on each.

## 2023-09-15 ENCOUNTER — Encounter: Payer: Self-pay | Admitting: Family Medicine

## 2023-09-15 ENCOUNTER — Ambulatory Visit (INDEPENDENT_AMBULATORY_CARE_PROVIDER_SITE_OTHER): Payer: Medicare Other | Admitting: Family Medicine

## 2023-09-15 VITALS — BP 143/65 | HR 70 | Temp 98.0°F | Resp 20 | Ht 64.0 in | Wt 136.0 lb

## 2023-09-15 DIAGNOSIS — R1013 Epigastric pain: Secondary | ICD-10-CM | POA: Diagnosis not present

## 2023-09-15 DIAGNOSIS — E039 Hypothyroidism, unspecified: Secondary | ICD-10-CM | POA: Diagnosis not present

## 2023-09-15 NOTE — Progress Notes (Signed)
Established Patient Office Visit  Subjective   Patient ID: Erin Gay, female    DOB: 1946-07-08  Age: 77 y.o. MRN: [id number]  Chief Complaint  Patient presents with   Medical Management of Chronic Issues    6 month follow up  patient states that she wants to discuss episodes where she feels at times she is going to pass out, she states that the feeling starts under her rib cage, and happens while she is talking mostly. Isn't sure if its due to her having a hiatal hernia    HPI  Hypothyroidism Taking thyroid replacement on an empty stomach daily.  Hx of colon cancer She was diagnosed in 2022. She was getting annual colonoscopies but recent scope, they advised her to repeat in 6 months around December 2024.  She has episodes when she is talking, she gets shortness of breath that sporadically happens and then it feels like she is going to pass out. She will break and rest during these episodes and it will resolve. This has been going on for the last year but more frequently the pass few months. She says her initial feeling is located in the midepigastric region. Not really pain per patient but says it then causes her SOB and the need to sit down. She hasn't passed out from these episodes. She reports having CT scan done of abdomen in May for surveillance of her hx of colon cancer. This was reviewed today and noted small hiatal hernia along with gallbladder abnormality; adenomyomatosis. She also reports when she got her colonoscopy in May, they also had to do an esophageal dilation. She denies GERD or indigestion symptoms.    Review of Systems  Gastrointestinal:        Midepigastric discomfort  All other systems reviewed and are negative.    Objective:     BP (!) 143/65   Pulse 70   Temp 98 F (36.7 C) (Oral)   Resp 20   Ht 5\' 4"  (1.626 m)   Wt 136 lb (61.7 kg)   SpO2 98%   BMI 23.34 kg/m    Physical Exam Vitals and nursing note reviewed.  Constitutional:       Appearance: Normal appearance. She is normal weight.  HENT:     Head: Normocephalic and atraumatic.     Right Ear: External ear normal.     Left Ear: External ear normal.     Nose: Nose normal.     Mouth/Throat:     Mouth: Mucous membranes are moist.     Pharynx: Oropharynx is clear.  Eyes:     Conjunctiva/sclera: Conjunctivae normal.     Pupils: Pupils are equal, round, and reactive to light.  Cardiovascular:     Rate and Rhythm: Normal rate and regular rhythm.     Pulses: Normal pulses.     Heart sounds: Normal heart sounds.  Pulmonary:     Effort: Pulmonary effort is normal.     Breath sounds: Normal breath sounds.  Abdominal:     General: Abdomen is flat. Bowel sounds are normal. There is no distension.     Tenderness: There is no abdominal tenderness.  Skin:    General: Skin is warm.     Capillary Refill: Capillary refill takes less than 2 seconds.  Neurological:     General: No focal deficit present.     Mental Status: She is alert and oriented to person, place, and time. Mental status is at baseline.  Psychiatric:  Mood and Affect: Mood normal.        Behavior: Behavior normal.        Thought Content: Thought content normal.        Judgment: Judgment normal.    No results found for any visits on 09/15/23.    The 10-year ASCVD risk score (Arnett DK, et al., 2019) is: 29.2%    Assessment & Plan:   Problem List Items Addressed This Visit       Endocrine   Hypothyroidism - Primary  Acquired hypothyroidism -     TSH -     T4, free  Midepigastric pain -     Basic metabolic panel -     US ABDOMEN LIMITED RUQ (LIVER/GB); Future   To check thyroid function and adjust medication if indicated. For now continue regimen. Unsure of cause of midepigastric discomfort and feeling of passing out. Due to abnormal findings on CT scan of abdomen, will proceed with ultrasound to look at gallbladder further. Could also be from possible esophageal spasms? Will check  electrolytes today.   No follow-ups on file.    Suzan Slick, MD

## 2023-09-16 ENCOUNTER — Encounter: Payer: Self-pay | Admitting: Family Medicine

## 2023-09-16 LAB — BASIC METABOLIC PANEL
BUN/Creatinine Ratio: 26 (ref 12–28)
BUN: 20 mg/dL (ref 8–27)
CO2: 24 mmol/L (ref 20–29)
Calcium: 10.9 mg/dL — ABNORMAL HIGH (ref 8.7–10.3)
Chloride: 103 mmol/L (ref 96–106)
Creatinine, Ser: 0.77 mg/dL (ref 0.57–1.00)
Glucose: 82 mg/dL (ref 70–99)
Potassium: 4.2 mmol/L (ref 3.5–5.2)
Sodium: 141 mmol/L (ref 134–144)
eGFR: 80 mL/min/{1.73_m2} (ref >59)

## 2023-09-16 LAB — TSH: TSH: 4.42 u[IU]/mL (ref 0.450–4.500)

## 2023-09-16 LAB — T4, FREE: Free T4: 1.36 ng/dL (ref 0.82–1.77)

## 2023-09-18 ENCOUNTER — Other Ambulatory Visit: Payer: Medicare Other

## 2023-09-18 DIAGNOSIS — R1013 Epigastric pain: Secondary | ICD-10-CM | POA: Diagnosis not present

## 2023-09-20 ENCOUNTER — Other Ambulatory Visit: Payer: Self-pay | Admitting: Family Medicine

## 2023-09-20 DIAGNOSIS — E876 Hypokalemia: Secondary | ICD-10-CM

## 2023-10-25 ENCOUNTER — Other Ambulatory Visit: Payer: Self-pay | Admitting: Family Medicine

## 2023-10-25 DIAGNOSIS — E876 Hypokalemia: Secondary | ICD-10-CM

## 2023-10-27 ENCOUNTER — Ambulatory Visit (INDEPENDENT_AMBULATORY_CARE_PROVIDER_SITE_OTHER): Payer: Medicare Other | Admitting: Family Medicine

## 2023-10-27 ENCOUNTER — Other Ambulatory Visit: Payer: Self-pay | Admitting: Family Medicine

## 2023-10-27 ENCOUNTER — Encounter: Payer: Self-pay | Admitting: Family Medicine

## 2023-10-27 VITALS — BP 148/75 | HR 72 | Temp 98.6°F | Resp 18 | Ht 64.0 in | Wt 131.7 lb

## 2023-10-27 DIAGNOSIS — Z0000 Encounter for general adult medical examination without abnormal findings: Secondary | ICD-10-CM

## 2023-10-27 DIAGNOSIS — Z23 Encounter for immunization: Secondary | ICD-10-CM | POA: Diagnosis not present

## 2023-10-27 NOTE — Patient Instructions (Signed)
Fall Prevention in the Home, Adult Falls can cause injuries and can happen to people of all ages. There are many things you can do to make your home safer and to help prevent falls. What actions can I take to prevent falls? General information Use good lighting in all rooms. Make sure to: Replace any light bulbs that burn out. Turn on the lights in dark areas and use night-lights. Keep items that you use often in easy-to-reach places. Lower the shelves around your home if needed. Move furniture so that there are clear paths around it. Do not use throw rugs or other things on the floor that can make you trip. If any of your floors are uneven, fix them. Add color or contrast paint or tape to clearly mark and help you see: Grab bars or handrails. First and last steps of staircases. Where the edge of each step is. If you use a ladder or stepladder: Make sure that it is fully opened. Do not climb a closed ladder. Make sure the sides of the ladder are locked in place. Have someone hold the ladder while you use it. Know where your pets are as you move through your home. What can I do in the bathroom?     Keep the floor dry. Clean up any water on the floor right away. Remove soap buildup in the bathtub or shower. Buildup makes bathtubs and showers slippery. Use non-skid mats or decals on the floor of the bathtub or shower. Attach bath mats securely with double-sided, non-slip rug tape. If you need to sit down in the shower, use a non-slip stool. Install grab bars by the toilet and in the bathtub and shower. Do not use towel bars as grab bars. What can I do in the bedroom? Make sure that you have a light by your bed that is easy to reach. Do not use any sheets or blankets on your bed that hang to the floor. Have a firm chair or bench with side arms that you can use for support when you get dressed. What can I do in the kitchen? Clean up any spills right away. If you need to reach something  above you, use a step stool with a grab bar. Keep electrical cords out of the way. Do not use floor polish or wax that makes floors slippery. What can I do with my stairs? Do not leave anything on the stairs. Make sure that you have a light switch at the top and the bottom of the stairs. Make sure that there are handrails on both sides of the stairs. Fix handrails that are broken or loose. Install non-slip stair treads on all your stairs if they do not have carpet. Avoid having throw rugs at the top or bottom of the stairs. Choose a carpet that does not hide the edge of the steps on the stairs. Make sure that the carpet is firmly attached to the stairs. Fix carpet that is loose or worn. What can I do on the outside of my home? Use bright outdoor lighting. Fix the edges of walkways and driveways and fix any cracks. Clear paths of anything that can make you trip, such as tools or rocks. Add color or contrast paint or tape to clearly mark and help you see anything that might make you trip as you walk through a door, such as a raised step or threshold. Trim any bushes or trees on paths to your home. Check to see if handrails are loose  or broken and that both sides of all steps have handrails. Install guardrails along the edges of any raised decks and porches. Have leaves, snow, or ice cleared regularly. Use sand, salt, or ice melter on paths if you live where there is ice and snow during the winter. Clean up any spills in your garage right away. This includes grease or oil spills. What other actions can I take? Review your medicines with your doctor. Some medicines can cause dizziness or changes in blood pressure, which increase your risk of falling. Wear shoes that: Have a low heel. Do not wear high heels. Have rubber bottoms and are closed at the toe. Feel good on your feet and fit well. Use tools that help you move around if needed. These include: Canes. Walkers. Scooters. Crutches. Ask  your doctor what else you can do to help prevent falls. This may include seeing a physical therapist to learn to do exercises to move better and get stronger. Where to find more information Centers for Disease Control and Prevention, STEADI: TonerPromos.no [employer] on Aging: BaseRingTones.pl National Institute on Aging: BaseRingTones.pl Contact a doctor if: You are afraid of falling at home. You feel weak, drowsy, or dizzy at home. You fall at home. Get help right away if you: Lose consciousness or have trouble moving after a fall. Have a fall that causes a head injury. These symptoms may be an emergency. Get help right away. Call 911. Do not wait to see if the symptoms will go away. Do not drive yourself to the hospital. This information is not intended to replace advice given to you by your health care provider. Make sure you discuss any questions you have with your health care provider. Document Revised: 08/05/2022 Document Reviewed: 08/05/2022 Elsevier Patient Education  2024 Elsevier Inc.  Health Maintenance, Female Adopting a healthy lifestyle and getting preventive care are important in promoting health and wellness. Ask your health care provider about: The right schedule for you to have regular tests and exams. Things you can do on your own to prevent diseases and keep yourself healthy. What should I know about diet, weight, and exercise? Eat a healthy diet  Eat a diet that includes plenty of vegetables, fruits, low-fat dairy products, and lean protein. Do not eat a lot of foods that are high in solid fats, added sugars, or sodium. Maintain a healthy weight Body mass index (BMI) is used to identify weight problems. It estimates body fat based on height and weight. Your health care provider can help determine your BMI and help you achieve or maintain a healthy weight. Get regular exercise Get regular exercise. This is one of the most important things you can do for your health. Most  adults should: Exercise for at least 150 minutes each week. The exercise should increase your heart rate and make you sweat (moderate-intensity exercise). Do strengthening exercises at least twice a week. This is in addition to the moderate-intensity exercise. Spend less time sitting. Even light physical activity can be beneficial. Watch cholesterol and blood lipids Have your blood tested for lipids and cholesterol at 76 years of age, then have this test every 5 years. Have your cholesterol levels checked more often if: Your lipid or cholesterol levels are high. You are older than 76 years of age. You are at high risk for heart disease. What should I know about cancer screening? Depending on your health history and family history, you may need to have cancer screening at various ages. This may  include screening for: Breast cancer. Cervical cancer. Colorectal cancer. Skin cancer. Lung cancer. What should I know about heart disease, diabetes, and high blood pressure? Blood pressure and heart disease High blood pressure causes heart disease and increases the risk of stroke. This is more likely to develop in people who have high blood pressure readings or are overweight. Have your blood pressure checked: Every 3-5 years if you are 69-34 years of age. Every year if you are 24 years old or older. Diabetes Have regular diabetes screenings. This checks your fasting blood sugar level. Have the screening done: Once every three years after age 5 if you are at a normal weight and have a low risk for diabetes. More often and at a younger age if you are overweight or have a high risk for diabetes. What should I know about preventing infection? Hepatitis B If you have a higher risk for hepatitis B, you should be screened for this virus. Talk with your health care provider to find out if you are at risk for hepatitis B infection. Hepatitis C Testing is recommended for: Everyone born from [birth year]  through 1964. Anyone with known risk factors for hepatitis C. Sexually transmitted infections (STIs) Get screened for STIs, including gonorrhea and chlamydia, if: You are sexually active and are younger than 76 years of age. You are older than 76 years of age and your health care provider tells you that you are at risk for this type of infection. Your sexual activity has changed since you were last screened, and you are at increased risk for chlamydia or gonorrhea. Ask your health care provider if you are at risk. Ask your health care provider about whether you are at high risk for HIV. Your health care provider may recommend a prescription medicine to help prevent HIV infection. If you choose to take medicine to prevent HIV, you should first get tested for HIV. You should then be tested every 3 months for as long as you are taking the medicine. Pregnancy If you are about to stop having your period (premenopausal) and you may become pregnant, seek counseling before you get pregnant. Take 400 to 800 micrograms (mcg) of folic acid every day if you become pregnant. Ask for birth control (contraception) if you want to prevent pregnancy. Osteoporosis and menopause Osteoporosis is a disease in which the bones lose minerals and strength with aging. This can result in bone fractures. If you are 17 years old or older, or if you are at risk for osteoporosis and fractures, ask your health care provider if you should: Be screened for bone loss. Take a calcium or vitamin D supplement to lower your risk of fractures. Be given hormone replacement therapy (HRT) to treat symptoms of menopause. Follow these instructions at home: Alcohol use Do not drink alcohol if: Your health care provider tells you not to drink. You are pregnant, may be pregnant, or are planning to become pregnant. If you drink alcohol: Limit how much you have to: 0-1 drink a day. Know how much alcohol is in your drink. In the [place] one  drink equals one 12 oz bottle of beer (355 mL), one 5 oz glass of wine (148 mL), or one 1 oz glass of hard liquor (44 mL). Lifestyle Do not use any products that contain nicotine or tobacco. These products include cigarettes, chewing tobacco, and vaping devices, such as e-cigarettes. If you need help quitting, ask your health care provider. Do not use street drugs. Do not share needles.  Ask your health care provider for help if you need support or information about quitting drugs. General instructions Schedule regular health, dental, and eye exams. Stay current with your vaccines. Tell your health care provider if: You often feel depressed. You have ever been abused or do not feel safe at home. Summary Adopting a healthy lifestyle and getting preventive care are important in promoting health and wellness. Follow your health care provider's instructions about healthy diet, exercising, and getting tested or screened for diseases. Follow your health care provider's instructions on monitoring your cholesterol and blood pressure. This information is not intended to replace advice given to you by your health care provider. Make sure you discuss any questions you have with your health care provider. Document Revised: 04/23/2021 Document Reviewed: 04/23/2021 Elsevier Patient Education  2024 [employer].

## 2023-10-27 NOTE — Progress Notes (Signed)
Subjective:   Erin Gay is a 77 y.o. female who presents for Medicare Annual (Subsequent) preventive examination.  Visit Complete: In person  Patient Medicare AWV questionnaire was completed by the patient on 10/27/23; I have confirmed that all information answered by patient is correct and no changes since this date.  Cardiac Risk Factors include: advanced age (>76men, >68 women);hypertension     Objective:    Today's Vitals   10/27/23 1011  BP: (!) 148/75  Pulse: 72  Resp: 18  Temp: 98.6 F (37 C)  TempSrc: Oral  SpO2: 99%  Weight: 131 lb 11.2 oz (59.7 kg)  Height: 5\' 4"  (1.626 m)   Body mass index is 22.61 kg/m.     10/27/2023   10:18 AM 03/11/2023   10:14 AM 04/14/2021    9:23 AM 07/29/2019   10:14 AM 09/08/2017    8:48 AM  Advanced Directives  Does Patient Have a Medical Advance Directive? Yes Yes No No No  Type of Estate agent of [place];Living will Living will     Does patient want to make changes to medical advance directive? Yes (MAU/Ambulatory/Procedural Areas - Information given)   No - Patient declined   Copy of Healthcare Power of Attorney in Chart? No - copy requested      Would patient like information on creating a medical advance directive?    No - Patient declined No - Patient declined    Current Medications (verified) Outpatient Encounter Medications as of 10/27/2023  Medication Sig   Calcium Carbonate (CALCIUM 600 PO) Take 1 tablet by mouth daily.   cholecalciferol (VITAMIN D3) 25 MCG (1000 UT) tablet Take 2,000 Units by mouth daily.   famotidine (PEPCID) 20 MG tablet Take 20 mg by mouth daily.   hydrochlorothiazide (HYDRODIURIL) 25 MG tablet Take 1 tablet (25 mg total) by mouth daily.   levothyroxine (SYNTHROID) 50 MCG tablet Take 1 tablet (50 mcg total) by mouth daily.   Multiple Vitamin (MULTIVITAMIN) capsule Take 1 capsule daily by mouth.   Multiple Vitamins-Minerals (PRESERVISION AREDS 2) CAPS Take by mouth.    potassium chloride (KLOR-CON) 10 MEQ tablet TAKE 1 TABLET BY MOUTH DAILY   [DISCONTINUED] fluvastatin (LESCOL) 40 MG capsule Take 1 capsule (40 mg total) by mouth every other day.   No facility-administered encounter medications on file as of 10/27/2023.    Allergies (verified) Patient has no known allergies.   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 per pt   TONSILLECTOMY  77 years old   Family History  Problem Relation Age of Onset   Cancer Mother        pt unsure, colon resection done   Heart disease Mother    Hyperlipidemia Mother    Colon polyps Mother    Heart disease Father    Heart disease Brother    Diabetes Brother        t2dm   Social History   Socioeconomic History   Marital status: Widowed  Spouse name: Not on file   Number of children: 3   Years of education: Not on file   Highest education level: Not on file  Occupational History   Not on file  Tobacco Use   Smoking status: Never    Passive exposure: Never   Smokeless tobacco: Never  Vaping Use   Vaping status: Never Used  Substance and Sexual Activity   Alcohol use: No    Alcohol/week: 0.0 standard drinks of alcohol   Drug use: No   Sexual activity: Not Currently  Other Topics Concern   Not on file  Social History Narrative   Husband passed Feb 2024 from covid and pneumonia/complications.   She enjoys bowling and is in a league in [place]   She also helps take care of her Husband's aunt who is in an assisted living. She is the [employer]   Social Determinants of Health   Financial Resource Strain:  Low Risk  (10/30/2021)   Received from [facility], [facility]   Overall Financial Resource Strain (CARDIA)    Difficulty of Paying Living Expenses: Not hard at all  Food Insecurity: No Food Insecurity (01/09/2022)   Received from [facility], [facility]   Hunger Vital Sign    Worried About Running Out of Food in the Last Year: Never true    Ran Out of Food in the Last Year: Never true  Transportation Needs: No Transportation Needs (10/30/2021)   Received from [facility], [facility]   PRAPARE - Transportation    Lack of Transportation (Medical): No    Lack of Transportation (Non-Medical): No  Physical Activity: Insufficiently Active (10/30/2021)   Received from [facility], [facility]   Exercise Vital Sign    Days of Exercise per Week: 1 day    Minutes of Exercise per Session: 10 min  Stress: No Stress Concern Present (10/30/2021)   Received from [facility], [facility] Occupational Health - Occupational Stress Questionnaire    Feeling of Stress : Not at all  Social Connections: Unknown (04/18/2022)   Received from [facility], [facility]   Social Network    Social Network: Not on file    Tobacco Counseling Counseling given: Not Answered   Clinical Intake:  Pre-visit preparation completed: Yes  Pain : No/denies pain     BMI - recorded: 22.6 Nutritional Status: BMI of 19-24  Normal Nutritional Risks: None Diabetes: No  How often do you need to have someone help you when you read instructions, pamphlets, or other written materials from your doctor or pharmacy?: 1 - Never What is the last grade level you completed in school?: 12th  Interpreter Needed?: No      Activities of Daily Living    10/27/2023   10:16 AM  In your present state of health, do you have any difficulty performing the following activities:  Hearing? 0  Vision? 0  Difficulty concentrating or making decisions? 0  Walking or climbing stairs? 0   Dressing or bathing? 0  Doing errands, shopping? 0  Preparing Food and eating ? N  Using the Toilet? N  In the past six months, have you accidently leaked urine? N  Do you have problems with loss of bowel control? N  Managing your Medications? N  Managing your Finances? N  Housekeeping or managing your Housekeeping? N    Patient Care Team: Suzan Slick, MD as PCP - General (Family Medicine)  Indicate any recent Medical Services  you may have received from other than [facility] providers in the past year (date may be approximate).     Assessment:   This is a routine wellness examination for [facility].  Hearing/Vision screen No results found.   Goals Addressed   None   Depression Screen    03/11/2023   10:14 AM 05/28/2022    8:43 AM 04/05/2022   10:26 AM 12/04/2021   10:35 AM 09/13/2021    1:58 PM 04/13/2021   10:56 AM 11/27/2020    2:20 PM  PHQ 2/9 Scores  PHQ - 2 Score 0 0 0 0 0 0 0  PHQ- 9 Score 0 0 0 0 2      Fall Risk    10/27/2023   10:18 AM 03/11/2023   10:12 AM 05/28/2022    8:43 AM 04/05/2022   10:26 AM 12/04/2021   10:35 AM  Fall Risk   Falls in the past year? 0 0 0 0 0  Number falls in past yr: 0 1 0 0 0  Injury with Fall? 0 0 0 0 0  Risk for fall due to : No Fall Risks No Fall Risks No Fall Risks No Fall Risks No Fall Risks  Follow up Falls evaluation completed Falls evaluation completed Falls evaluation completed Falls evaluation completed Falls evaluation completed    MEDICARE RISK AT HOME: Medicare Risk at Home Any stairs in or around the home?: No If so, are there any without handrails?: No Home free of loose throw rugs in walkways, pet beds, electrical cords, etc?: Yes Adequate lighting in your home to reduce risk of falls?: Yes Life alert?: No Use of a cane, walker or w/c?: No Grab bars in the bathroom?: Yes Shower chair or bench in shower?: No Elevated toilet seat or a handicapped toilet?: No  TIMED UP AND GO:  Was the test performed?  Yes   Length of time to ambulate 10 feet: 8 sec Gait steady and fast without use of assistive device    Cognitive Function:        10/27/2023   10:21 AM 07/29/2019   10:15 AM 09/08/2017    9:01 AM  6CIT Screen  What Year? 0 points 0 points 0 points  What month? 0 points 0 points 0 points  What time? 0 points 0 points 0 points  Count back from 20 0 points 0 points 0 points  Months in reverse 0 points 0 points 0 points  Repeat phrase 0 points 0 points 0 points  Total Score 0 points 0 points 0 points    Immunizations Immunization History  Administered Date(s) Administered   Fluad Quad(high Dose 65+) 08/20/2019, 11/27/2020   Influenza, High Dose Seasonal PF 11/09/2018   Influenza,inj,Quad PF,6+ Mos 10/24/2015, 10/01/2017, 09/14/2021   PFIZER(Purple Top)SARS-COV-2 Vaccination 01/21/2020, 02/11/2020, 09/10/2020, 09/30/2020   Pneumococcal Conjugate-13 03/17/2017   Pneumococcal Polysaccharide-23 08/20/2019    TDAP status: Up to date  Flu Vaccine status: Completed at today's visit  Pneumococcal vaccine status: Up to date  Covid-19 vaccine status: Declined, Education has been provided regarding the importance of this vaccine but patient still declined. Advised may receive this vaccine at local pharmacy or Health Dept.or vaccine clinic. Aware to provide a copy of the vaccination record if obtained from local pharmacy or Health Dept. Verbalized acceptance and understanding.  Qualifies for Shingles Vaccine? Yes   Zostavax completed No   Shingrix Completed?: No.    Education has been provided regarding the importance of this vaccine. Patient has been  advised to call insurance company to determine out of pocket expense if they have not yet received this vaccine. Advised may also receive vaccine at local pharmacy or Health Dept. Verbalized acceptance and understanding.  Screening Tests Health Maintenance  Topic Date Due   DTaP/Tdap/Td (1 - Tdap) Never done   Zoster Vaccines- Shingrix (1 of  2) Never done   INFLUENZA VACCINE  07/17/2023   COVID-19 Vaccine (5 - 2023-24 season) 08/17/2023   Colonoscopy  05/28/2024   Medicare Annual Wellness (AWV)  10/26/2024   Pneumonia Vaccine 20+ Years old  Completed   DEXA SCAN  Completed   Hepatitis C Screening  Completed   HPV VACCINES  Aged Out    Health Maintenance  Health Maintenance Due  Topic Date Due   DTaP/Tdap/Td (1 - Tdap) Never done   Zoster Vaccines- Shingrix (1 of 2) Never done   INFLUENZA VACCINE  07/17/2023   COVID-19 Vaccine (5 - 2023-24 season) 08/17/2023    Colorectal cancer screening: No longer required.   Mammogram status: No longer required due to age.  Bone Density status: Completed 11/2022. Results reflect: Bone density results: OSTEOPENIA. Repeat every 3 years.  Lung Cancer Screening: (Low Dose CT Chest recommended if Age 40-80 years, 20 pack-year currently smoking OR have quit w/in 15years.) does not qualify.   Lung Cancer Screening Referral: N/A  Additional Screening:  Hepatitis C Screening: does not qualify; Completed N/A  Vision Screening: Recommended annual ophthalmology exams for early detection of glaucoma and other disorders of the eye. Is the patient up to date with their annual eye exam?  Yes  Who is the provider or what is the name of the office in which the patient attends annual eye exams? unknown If pt is not established with a provider, would they like to be referred to a provider to establish care? No .   Dental Screening: Recommended annual dental exams for proper oral hygiene  Diabetic Foot Exam: Diabetic Foot Exam: Completed N/A  Community Resource Referral / Chronic Care Management: CRR required this visit?  No   CCM required this visit?  No     Plan:     I have personally reviewed and noted the following in the patient's chart:   Medical and social history Use of alcohol, tobacco or illicit drugs  Current medications and supplements including opioid prescriptions.  Patient is not currently taking opioid prescriptions. Functional ability and status Nutritional status Physical activity Advanced directives List of other physicians Hospitalizations, surgeries, and ER visits in previous 12 months Vitals Screenings to include cognitive, depression, and falls Referrals and appointments  In addition, I have reviewed and discussed with patient certain preventive protocols, quality metrics, and best practice recommendations. A written personalized care plan for preventive services as well as general preventive health recommendations were provided to patient.     Suzan Slick, MD   10/27/2023   After Visit Summary: (In Person-Printed) AVS printed and given to the patient  Nurse Notes: AWV

## 2023-11-06 ENCOUNTER — Other Ambulatory Visit: Payer: Self-pay | Admitting: Family Medicine

## 2023-11-06 DIAGNOSIS — I10 Essential (primary) hypertension: Secondary | ICD-10-CM

## 2023-11-15 ENCOUNTER — Other Ambulatory Visit: Payer: Self-pay | Admitting: Family Medicine

## 2023-11-15 DIAGNOSIS — E876 Hypokalemia: Secondary | ICD-10-CM

## 2023-11-17 DIAGNOSIS — I10 Essential (primary) hypertension: Secondary | ICD-10-CM | POA: Diagnosis not present

## 2023-11-17 DIAGNOSIS — Z1211 Encounter for screening for malignant neoplasm of colon: Secondary | ICD-10-CM | POA: Diagnosis not present

## 2023-11-17 DIAGNOSIS — D122 Benign neoplasm of ascending colon: Secondary | ICD-10-CM | POA: Diagnosis not present

## 2023-11-17 DIAGNOSIS — K635 Polyp of colon: Secondary | ICD-10-CM | POA: Diagnosis not present

## 2023-11-17 DIAGNOSIS — Z08 Encounter for follow-up examination after completed treatment for malignant neoplasm: Secondary | ICD-10-CM | POA: Diagnosis not present

## 2023-11-17 DIAGNOSIS — Z860101 Personal history of adenomatous and serrated colon polyps: Secondary | ICD-10-CM | POA: Diagnosis not present

## 2023-11-17 DIAGNOSIS — Z85038 Personal history of other malignant neoplasm of large intestine: Secondary | ICD-10-CM | POA: Diagnosis not present

## 2023-11-18 DIAGNOSIS — C185 Malignant neoplasm of splenic flexure: Secondary | ICD-10-CM | POA: Diagnosis not present

## 2023-11-18 DIAGNOSIS — I10 Essential (primary) hypertension: Secondary | ICD-10-CM | POA: Diagnosis not present

## 2023-11-19 LAB — CALCIUM: Calcium: 10.9 mg/dL — ABNORMAL HIGH (ref 8.7–10.3)

## 2024-01-04 ENCOUNTER — Other Ambulatory Visit: Payer: Self-pay | Admitting: Family Medicine

## 2024-01-04 DIAGNOSIS — I10 Essential (primary) hypertension: Secondary | ICD-10-CM

## 2024-01-08 ENCOUNTER — Ambulatory Visit (INDEPENDENT_AMBULATORY_CARE_PROVIDER_SITE_OTHER): Payer: Medicare Other

## 2024-01-08 ENCOUNTER — Ambulatory Visit: Payer: Medicare Other | Admitting: Podiatry

## 2024-01-08 ENCOUNTER — Encounter: Payer: Self-pay | Admitting: Podiatry

## 2024-01-08 DIAGNOSIS — M722 Plantar fascial fibromatosis: Secondary | ICD-10-CM

## 2024-01-08 DIAGNOSIS — M19072 Primary osteoarthritis, left ankle and foot: Secondary | ICD-10-CM

## 2024-01-08 DIAGNOSIS — M778 Other enthesopathies, not elsewhere classified: Secondary | ICD-10-CM

## 2024-01-08 DIAGNOSIS — M7732 Calcaneal spur, left foot: Secondary | ICD-10-CM | POA: Diagnosis not present

## 2024-01-08 MED ORDER — TRIAMCINOLONE ACETONIDE 10 MG/ML IJ SUSP: 5.0000 mg | Freq: Once | INTRAMUSCULAR | Status: AC

## 2024-01-08 MED ORDER — MELOXICAM 7.5 MG PO TABS: 7.5000 mg | ORAL_TABLET | Freq: Every day | ORAL | 0 refills | Status: DC | PRN

## 2024-01-08 NOTE — Patient Instructions (Addendum)
For inserts I like POWESTEPS, SUPERFEET, AETREX   --  If was nice to meet you today. If you have any questions or any further concerns, please feel fee to give me a call. You can call our office at [phone] or please feel fee to send me a message through MyChart.   --- While at your visit today you received a steroid injection in your foot or ankle to help with your pain. Along with having the steroid medication there is some "numbing" medication in the shot that you received. Due to this you may notice some numbness to the area for the next couple of hours.   I would recommend limiting activity for the next few days to help the steroid injection take affect.    The actually benefit from the steroid injection may take up to 2-7 days to see a difference. You may actually experience a small (as in 10%) INCREASE in pain in the first 24 hours---that is common. It would be best if you can ice the area today and take anti-inflammatory medications (such as Ibuprofen, Motrin, or Aleve) if you are able to take these medications. If you were prescribed another medication to help with the pain go ahead and start that medication today    Things to watch out for that you should contact us or a health care provider urgently would include: 1. Unusual (as in more than 10%) increase in pain 2. New fever > 101.5 3. New swelling or redness of the injected area.  4. Streaking of red lines around the area injected.  If you have any questions or concerns about this, please give our office a call at [phone].    --  Plantar Fasciitis (Heel Spur Syndrome) with Rehab The plantar fascia is a fibrous, ligament-like, soft-tissue structure that spans the bottom of the foot. Plantar fasciitis is a condition that causes pain in the foot due to inflammation of the tissue. SYMPTOMS  Pain and tenderness on the underneath side of the foot. Pain that worsens with standing or walking. CAUSES  Plantar fasciitis is  caused by irritation and injury to the plantar fascia on the underneath side of the foot. Common mechanisms of injury include: Direct trauma to bottom of the foot. Damage to a small nerve that runs under the foot where the main fascia attaches to the heel bone. Stress placed on the plantar fascia due to bone spurs. RISK INCREASES WITH:  Activities that place stress on the plantar fascia (running, jumping, pivoting, or cutting). Poor strength and flexibility. Improperly fitted shoes. Tight calf muscles. Flat feet. Failure to warm-up properly before activity. Obesity. PREVENTION Warm up and stretch properly before activity. Allow for adequate recovery between workouts. Maintain physical fitness: Strength, flexibility, and endurance. Cardiovascular fitness. Maintain a health body weight. Avoid stress on the plantar fascia. Wear properly fitted shoes, including arch supports for individuals who have flat feet.  PROGNOSIS  If treated properly, then the symptoms of plantar fasciitis usually resolve without surgery. However, occasionally surgery is necessary.  RELATED COMPLICATIONS  Recurrent symptoms that may result in a chronic condition. Problems of the lower back that are caused by compensating for the injury, such as limping. Pain or weakness of the foot during push-off following surgery. Chronic inflammation, scarring, and partial or complete fascia tear, occurring more often from repeated injections.  TREATMENT  Treatment initially involves the use of ice and medication to help reduce pain and inflammation. The use of strengthening and stretching exercises may help  reduce pain with activity, especially stretches of the Achilles tendon. These exercises may be performed at home or with a therapist. Your caregiver may recommend that you use heel cups of arch supports to help reduce stress on the plantar fascia. Occasionally, corticosteroid injections are given to reduce inflammation. If  symptoms persist for greater than 6 months despite non-surgical (conservative), then surgery may be recommended.   MEDICATION  If pain medication is necessary, then nonsteroidal anti-inflammatory medications, such as aspirin and ibuprofen, or other minor pain relievers, such as acetaminophen, are often recommended. Do not take pain medication within 7 days before surgery. Prescription pain relievers may be given if deemed necessary by your caregiver. Use only as directed and only as much as you need. Corticosteroid injections may be given by your caregiver. These injections should be reserved for the most serious cases, because they may only be given a certain number of times.  HEAT AND COLD Cold treatment (icing) relieves pain and reduces inflammation. Cold treatment should be applied for 10 to 15 minutes every 2 to 3 hours for inflammation and pain and immediately after any activity that aggravates your symptoms. Use ice packs or massage the area with a piece of ice (ice massage). Heat treatment may be used prior to performing the stretching and strengthening activities prescribed by your caregiver, physical therapist, or athletic trainer. Use a heat pack or soak the injury in warm water.  SEEK IMMEDIATE MEDICAL CARE IF: Treatment seems to offer no benefit, or the condition worsens. Any medications produce adverse side effects.  EXERCISES- RANGE OF MOTION (ROM) AND STRETCHING EXERCISES - Plantar Fasciitis (Heel Spur Syndrome) These exercises may help you when beginning to rehabilitate your injury. Your symptoms may resolve with or without further involvement from your physician, physical therapist or athletic trainer. While completing these exercises, remember:  Restoring tissue flexibility helps normal motion to return to the joints. This allows healthier, less painful movement and activity. An effective stretch should be held for at least 30 seconds. A stretch should never be painful. You  should only feel a gentle lengthening or release in the stretched tissue.  RANGE OF MOTION - Toe Extension, Flexion Sit with your right / left leg crossed over your opposite knee. Grasp your toes and gently pull them back toward the top of your foot. You should feel a stretch on the bottom of your toes and/or foot. Hold this stretch for 10 seconds. Now, gently pull your toes toward the bottom of your foot. You should feel a stretch on the top of your toes and or foot. Hold this stretch for 10 seconds. Repeat  times. Complete this stretch 3 times per day.   RANGE OF MOTION - Ankle Dorsiflexion, Active Assisted Remove shoes and sit on a chair that is preferably not on a carpeted surface. Place right / left foot under knee. Extend your opposite leg for support. Keeping your heel down, slide your right / left foot back toward the chair until you feel a stretch at your ankle or calf. If you do not feel a stretch, slide your bottom forward to the edge of the chair, while still keeping your heel down. Hold this stretch for 10 seconds. Repeat 3 times. Complete this stretch 2 times per day.   STRETCH  Gastroc, Standing Place hands on wall. Extend right / left leg, keeping the front knee somewhat bent. Slightly point your toes inward on your back foot. Keeping your right / left heel on the floor and  your knee straight, shift your weight toward the wall, not allowing your back to arch. You should feel a gentle stretch in the right / left calf. Hold this position for 10 seconds. Repeat 3 times. Complete this stretch 2 times per day.  STRETCH  Soleus, Standing Place hands on wall. Extend right / left leg, keeping the other knee somewhat bent. Slightly point your toes inward on your back foot. Keep your right / left heel on the floor, bend your back knee, and slightly shift your weight over the back leg so that you feel a gentle stretch deep in your back calf. Hold this position for 10  seconds. Repeat 3 times. Complete this stretch 2 times per day.  STRETCH  Gastrocsoleus, Standing  Note: This exercise can place a lot of stress on your foot and ankle. Please complete this exercise only if specifically instructed by your caregiver.  Place the ball of your right / left foot on a step, keeping your other foot firmly on the same step. Hold on to the wall or a rail for balance. Slowly lift your other foot, allowing your body weight to press your heel down over the edge of the step. You should feel a stretch in your right / left calf. Hold this position for 10 seconds. Repeat this exercise with a slight bend in your right / left knee. Repeat 3 times. Complete this stretch 2 times per day.   STRENGTHENING EXERCISES - Plantar Fasciitis (Heel Spur Syndrome)  These exercises may help you when beginning to rehabilitate your injury. They may resolve your symptoms with or without further involvement from your physician, physical therapist or athletic trainer. While completing these exercises, remember:  Muscles can gain both the endurance and the strength needed for everyday activities through controlled exercises. Complete these exercises as instructed by your physician, physical therapist or athletic trainer. Progress the resistance and repetitions only as guided.  STRENGTH - Towel Curls Sit in a chair positioned on a non-carpeted surface. Place your foot on a towel, keeping your heel on the floor. Pull the towel toward your heel by only curling your toes. Keep your heel on the floor. Repeat 3 times. Complete this exercise 2 times per day.  STRENGTH - Ankle Inversion Secure one end of a rubber exercise band/tubing to a fixed object (table, pole). Loop the other end around your foot just before your toes. Place your fists between your knees. This will focus your strengthening at your ankle. Slowly, pull your big toe up and in, making sure the band/tubing is positioned to resist the  entire motion. Hold this position for 10 seconds. Have your muscles resist the band/tubing as it slowly pulls your foot back to the starting position. Repeat 3 times. Complete this exercises 2 times per day.  Document Released: 12/02/2005 Document Revised: 02/24/2012 Document Reviewed: 03/16/2009 [facility] Patient Information 2014 [place], [place].

## 2024-01-08 NOTE — Progress Notes (Signed)
Subjective:  Patient ID: Erin Gay, female    DOB: 1946/05/04,  MRN: [id number]  Chief Complaint  Patient presents with   Foot Pain    Rm#11 Left heel pain for several months now.Pain in on the back part of heel shooting pains at time unable to walk around for long before pain starts.    Discussed the use of AI scribe software for clinical note transcription with the patient, who gave verbal consent to proceed.  History of Present Illness           78 y.o. female presents with a three-month history of left heel pain. The pain is described as severe, especially noticeable when getting up in the middle of the night, and often results in limping while walking. The patient denies any known injuries that could have initiated the pain, initially attributing it to a stone bruise, then a bone spur, and finally considering plantar fasciitis due to the persistent and worsening nature of the pain. The patient has not received any treatment for the heel pain. The patient also mentions a previous foot injury on the top of the foot a few years ago which still causes occasional pain.   Objective:    Physical Exam          General: AAO x3, NAD  Dermatological: Skin is warm, dry and supple bilateral.  There are no open sores, no preulcerative lesions, no rash or signs of infection present.  Vascular: Dorsalis Pedis artery and Posterior Tibial artery pedal pulses are 2/4 bilateral with immedate capillary fill time. There is no pain with calf compression, swelling, warmth, erythema. Varicose veins/prominent veins present.   Neruologic: Grossly intact via light touch bilateral. Negative tinel sign.   Musculoskeletal: Tenderness to palpation along the plantar medial tubercle of the calcaneus at the insertion of plantar fascia on the left foot. There is no pain along the course of the plantar fascia within the arch of the foot. Plantar fascia appears to be intact. There is no pain with lateral  compression of the calcaneus or pain with vibratory sensation. There is no pain along the course or insertion of the achilles tendon. She does experience some discomfort dorsal aspect of the Lisfranc joint.  There is no area pinpoint tenderness.  No other areas of tenderness to bilateral lower extremities.  Gait: Unassisted, Nonantalgic.   No images are attached to the encounter.    Results          Assessment:   Plantar fasciitis, heel spur; capsulitis midfoot  Plan:  Patient was evaluated and treated and all questions answered.  Assessment and Plan          Left Heel Pain Pain for 3 months, worse with first steps in the morning and after prolonged standing. No prior treatment. Physical exam and x-ray findings suggestive of plantar fasciitis with a small heel spur. -Administered corticosteroid injection today for pain relief. See procedure note below -Start Meloxicam as needed for pain. -Advise daily icing and stretching exercises. -Recommend use of a brace during the day for additional support. Plantar fascial brace dispensed to help support the plantar fascia and facilitate healing.  -Consider use of insoles for arch support. -Follow-up in 4-6 weeks or sooner if symptoms worsen.  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 -Continue supportive shoegear -Mobic   Varicose Veins Patient has visible varicose veins and concerns about appearance. -Recommend compression stockings. -Consider referral to a vein specialist if symptoms worsen  or for cosmetic concerns.  Procedure: Injection Tendon/Ligament Discussed alternatives, risks, complications and verbal consent was obtained.  Location: Left plantar fascia at the glabrous junction; medial approach. Skin Prep: Alcohol. Injectate: 0.5cc 0.5% marcaine plain, 0.5 cc 2% lidocaine plain and, 1 cc kenalog 10. Disposition: Patient tolerated procedure well. Injection site dressed with a band-aid.   Post-injection care was discussed and return precautions discussed.     Return for heel pain in 4-6 weeks.   Vivi Barrack DPM

## 2024-02-02 NOTE — Telephone Encounter (Signed)
Copied from CRM [id number]. Topic: Clinical - Medication Question >> Feb 02, 2024  8:34 AM Erin Gay wrote: Reason for CRM: Patient is calling b/c she doesn't know if she has the flu or a cold. Patient wants to know if Dr.Rucker can call something in for her. If patients needs to be contacted, she can be reached by phone at [phone].

## 2024-02-02 NOTE — Telephone Encounter (Signed)
Will need appointment to properly evaluate.

## 2024-02-02 NOTE — Telephone Encounter (Signed)
 Patient has been scheduled

## 2024-02-03 ENCOUNTER — Encounter: Payer: Self-pay | Admitting: Family Medicine

## 2024-02-03 ENCOUNTER — Ambulatory Visit (INDEPENDENT_AMBULATORY_CARE_PROVIDER_SITE_OTHER): Payer: Medicare Other | Admitting: Family Medicine

## 2024-02-03 VITALS — BP 149/77 | HR 74 | Temp 97.5°F | Ht 64.0 in | Wt 137.1 lb

## 2024-02-03 DIAGNOSIS — Z20828 Contact with and (suspected) exposure to other viral communicable diseases: Secondary | ICD-10-CM

## 2024-02-03 DIAGNOSIS — R051 Acute cough: Secondary | ICD-10-CM

## 2024-02-03 LAB — POCT INFLUENZA A/B
Influenza A, POC: NEGATIVE
Influenza B, POC: NEGATIVE

## 2024-02-03 MED ORDER — BENZONATATE 200 MG PO CAPS: 200.0000 mg | ORAL_CAPSULE | Freq: Two times a day (BID) | ORAL | 0 refills | Status: DC | PRN

## 2024-02-03 NOTE — Progress Notes (Signed)
Acute Office Visit  Subjective:     Patient ID: Princesa Willig, female    DOB: 1946-09-09, 78 y.o.   MRN: [id number]  Chief Complaint  Patient presents with   Cough    C/o cough, nasal congestion, feels different in upper part of throat x Saturday evening - exposed to someone who had flu - Wednesday 01/28/24.     Cough Pertinent negatives include no shortness of breath or wheezing.  Patient is in today for acute visit.  Pt reports flu exposure on last Wednesday. Her symptoms started on Saturday with nasal congestion along with her throat irritated. She reports the cough started on Saturday. She also noticed hoarseness in her voice.  She does have ear pain on the left when trying to blow her nose. She reports she doesn't feel bad. The cough is the worst symptom. She reports she took Tylenol on Sunday. She hasn't done anything for cough.   Pt also has hx of hypercalcemia. She was advised to return for PTH and she never did. She would like to do this today.  Review of Systems  Respiratory:  Positive for cough. Negative for shortness of breath and wheezing.   All other systems reviewed and are negative.      Objective:    BP (!) 149/77   Pulse 74   Temp (!) 97.5 F (36.4 C)   Ht 5\' 4"  (1.626 m)   Wt 137 lb 1 oz (62.2 kg)   SpO2 98%   BMI 23.53 kg/m  BP Readings from Last 3 Encounters:  02/03/24 (!) 149/77  10/27/23 (!) 148/75  09/15/23 (!) 143/65      Physical Exam Vitals and nursing note reviewed.  Constitutional:      Appearance: Normal appearance. She is normal weight.  HENT:     Head: Normocephalic and atraumatic.     Right Ear: External ear normal.     Left Ear: External ear normal.     Nose: Nose normal.     Mouth/Throat:     Mouth: Mucous membranes are moist.     Pharynx: Oropharynx is clear.  Eyes:     Conjunctiva/sclera: Conjunctivae normal.     Pupils: Pupils are equal, round, and reactive to light.  Cardiovascular:     Rate and Rhythm: Normal  rate and regular rhythm.     Pulses: Normal pulses.     Heart sounds: Normal heart sounds.  Pulmonary:     Effort: Pulmonary effort is normal.     Breath sounds: Normal breath sounds.  Skin:    General: Skin is warm.     Capillary Refill: Capillary refill takes less than 2 seconds.  Neurological:     General: No focal deficit present.     Mental Status: She is alert and oriented to person, place, and time. Mental status is at baseline.  Psychiatric:        Mood and Affect: Mood normal.        Behavior: Behavior normal.        Thought Content: Thought content normal.        Judgment: Judgment normal.   No results found for any visits on 02/03/24.      Assessment & Plan:   Problem List Items Addressed This Visit   None Visit Diagnoses       Exposure to the flu    -  Primary   Relevant Orders   POCT Influenza A/B     Acute cough  Relevant Orders   POCT Influenza A/B      Exposure to the flu -     POCT Influenza A/B  Acute cough -     POCT Influenza A/B -     Benzonatate; Take 1 capsule (200 mg total) by mouth 2 (two) times daily as needed for cough.  Dispense: 20 capsule; Refill: 0  Hypercalcemia -     Parathyroid hormone, intact (no Ca)   Flu negative. Likely URI viral. Treat with Tessalon 200mg  BID prn for cough, also add honey/tea and vicks rub at night prn for cough. To check PTH due to continued elevated calcium levels.   No orders of the defined types were placed in this encounter.   No follow-ups on file.  Suzan Slick, MD  Total time spent with patient today 31 minutes. This includes reviewing records, evaluating the patient and coordinating care. Face-to-face time >50%.

## 2024-02-04 LAB — PARATHYROID HORMONE, INTACT (NO CA): PTH: 65 pg/mL (ref 15–65)

## 2024-02-09 ENCOUNTER — Ambulatory Visit: Payer: Medicare Other | Admitting: Podiatry

## 2024-02-16 ENCOUNTER — Telehealth: Payer: Self-pay | Admitting: Family Medicine

## 2024-02-16 NOTE — Telephone Encounter (Signed)
 Copied from CRM [id number]. Topic: General - Other >> Feb 16, 2024 10:10 AM Kristie Cowman wrote: Reason for CRM: The patient was just seen recently on 2/18 and she was wondering If Dr. Wyline Mood still wanted her to keep her 3/11 office visit.

## 2024-02-16 NOTE — Telephone Encounter (Signed)
 Pt can cancel the 3/11 appt and make for 6 months follow up

## 2024-02-21 ENCOUNTER — Other Ambulatory Visit: Payer: Self-pay | Admitting: Family Medicine

## 2024-02-21 DIAGNOSIS — E876 Hypokalemia: Secondary | ICD-10-CM

## 2024-02-23 ENCOUNTER — Other Ambulatory Visit: Payer: Self-pay | Admitting: Family Medicine

## 2024-02-23 DIAGNOSIS — E876 Hypokalemia: Secondary | ICD-10-CM

## 2024-02-23 MED ORDER — POTASSIUM CHLORIDE ER 10 MEQ PO TBCR: 10.0000 meq | EXTENDED_RELEASE_TABLET | Freq: Every day | ORAL | 1 refills | Status: DC

## 2024-02-23 NOTE — Telephone Encounter (Signed)
 Copied from CRM [id number]. Topic: Clinical - Medication Refill >> Feb 23, 2024 10:24 AM Shelah Lewandowsky wrote: Most Recent Primary Care Visit:  Provider: Suzan Slick  Department: [facility]  Visit Type: SAME DAY  Date: 02/03/2024  Medication: potassium chloride (KLOR-CON) 10 MEQ tablet  Has the patient contacted their pharmacy? No (Agent: If no, request that the patient contact the pharmacy for the refill. If patient does not wish to contact the pharmacy document the reason why and proceed with request.) (Agent: If yes, when and what did the pharmacy advise?)  Is this the correct pharmacy for this prescription? Yes If no, delete pharmacy and type the correct one.  This is the patient's preferred pharmacy:  [facility] - [place], [place] - [address] [place] [address] [place] [address] Phone: [phone] Fax: [phone]   Has the prescription been filled recently? Yes  Is the patient out of the medication? No  Has the patient been seen for an appointment in the last year OR does the patient have an upcoming appointment? Yes  Can we respond through MyChart? Yes  Agent: Please be advised that Rx refills may take up to 3 business days. We ask that you follow-up with your pharmacy.

## 2024-02-24 ENCOUNTER — Ambulatory Visit: Payer: Medicare Other | Admitting: Family Medicine

## 2024-03-23 ENCOUNTER — Encounter: Payer: Self-pay | Admitting: Family Medicine

## 2024-03-23 ENCOUNTER — Ambulatory Visit (INDEPENDENT_AMBULATORY_CARE_PROVIDER_SITE_OTHER): Admitting: Family Medicine

## 2024-03-23 ENCOUNTER — Ambulatory Visit

## 2024-03-23 VITALS — BP 132/65 | HR 82 | Temp 97.6°F | Resp 18 | Ht 64.0 in | Wt 137.9 lb

## 2024-03-23 DIAGNOSIS — C73 Malignant neoplasm of thyroid gland: Secondary | ICD-10-CM | POA: Diagnosis not present

## 2024-03-23 DIAGNOSIS — Z85038 Personal history of other malignant neoplasm of large intestine: Secondary | ICD-10-CM

## 2024-03-23 DIAGNOSIS — R590 Localized enlarged lymph nodes: Secondary | ICD-10-CM | POA: Diagnosis not present

## 2024-03-23 DIAGNOSIS — M542 Cervicalgia: Secondary | ICD-10-CM | POA: Diagnosis not present

## 2024-03-23 DIAGNOSIS — Z85850 Personal history of malignant neoplasm of thyroid: Secondary | ICD-10-CM

## 2024-03-23 NOTE — Progress Notes (Signed)
   Acute Office Visit  Subjective:     Patient ID: Erin Gay, female    DOB: Jul 23, 1946, 78 y.o.   MRN: [id number]  Chief Complaint  Patient presents with   lymph node swelling    Patient states that she has been having pain on the left side of her neck on and off for about year. She states that just this past Friday a knot appeared on the left side of her neck and it is sore.    HPI Patient is in today for acute visit. Pt is with daughter.  Pt reports she's had left neck pain for the last year. She reports Friday night, she noticed a knot in her neck on the left side. Pt was concerned due to hx of colon cancer.   Review of Systems  HENT:         Enlarged left lymph node  All other systems reviewed and are negative.      Objective:    BP 132/65   Pulse 82   Temp 97.6 F (36.4 C) (Oral)   Resp 18   Ht 5\' 4"  (1.626 m)   Wt 137 lb 14.4 oz (62.6 kg)   SpO2 98%   BMI 23.67 kg/m    Physical Exam Vitals and nursing note reviewed.  Constitutional:      Appearance: Normal appearance. She is normal weight.  HENT:     Head: Normocephalic and atraumatic.     Right Ear: External ear normal.     Left Ear: External ear normal.     Nose: Nose normal.     Mouth/Throat:     Mouth: Mucous membranes are moist.     Pharynx: Oropharynx is clear.  Eyes:     Conjunctiva/sclera: Conjunctivae normal.     Pupils: Pupils are equal, round, and reactive to light.  Neck:     Comments: Left enlarged lymph node Cardiovascular:     Rate and Rhythm: Normal rate.  Pulmonary:     Effort: Pulmonary effort is normal.  Abdominal:     General: Abdomen is flat. Bowel sounds are normal.  Skin:    General: Skin is warm.     Capillary Refill: Capillary refill takes less than 2 seconds.  Neurological:     General: No focal deficit present.     Mental Status: She is alert and oriented to person, place, and time. Mental status is at baseline.  Psychiatric:        Mood and Affect: Mood  normal.        Behavior: Behavior normal.        Thought Content: Thought content normal.        Judgment: Judgment normal.   No results found for any visits on 03/23/24.      Assessment & Plan:   Problem List Items Addressed This Visit   None Enlarged lymph node in neck -     US SOFT TISSUE HEAD & NECK (NON-THYROID); Future   Send for ultrasound for further evaluation. Follow up pending results of u/s.  No orders of the defined types were placed in this encounter.   No follow-ups on file.  Suzan Slick, MD

## 2024-03-24 ENCOUNTER — Other Ambulatory Visit (HOSPITAL_COMMUNITY): Payer: Self-pay | Admitting: Family Medicine

## 2024-03-24 DIAGNOSIS — R9389 Abnormal findings on diagnostic imaging of other specified body structures: Secondary | ICD-10-CM

## 2024-03-24 NOTE — Progress Notes (Signed)
 Leonides Sake, MD  Claudean Kinds PROCEDURE / BIOPSY REVIEW Date: 03/24/24  Requested Biopsy site: L neck LN Reason for request: Mir recommended, h/o thyroid ca Imaging review: Best seen on [place] 03/23/24  Decision: Approved Imaging modality to perform: Ultrasound Schedule with: No sedation / Local anesthetic Schedule for: Any VIR  Additional comments:   Please contact me with questions, concerns, or if issue pertaining to this request arise.  Dayne Oley Balm, MD [facility]       Previous Messages    ----- Message ----- From: Claudean Kinds Sent: 03/24/2024  10:16 AM EDT To: Claudean Kinds; Ir Procedure Requests Subject: [place] FNA soft tissue                            Procedure : [place] FNA soft tissue  Reason : enlarged LN on left neck Dx: Abnormal ultrasound [R93.89 (ICD-10-CM)]  Ordering Comments  Needs FNA of enlarged lymph node on left neck    History : US Soft tissue head and neck  Provider : Suzan Slick, MD  Contact: [phone]

## 2024-04-14 ENCOUNTER — Ambulatory Visit (HOSPITAL_COMMUNITY)
Admission: RE | Admit: 2024-04-14 | Discharge: 2024-04-14 | Disposition: A | Discharge status: Home / Self Care | Source: Ambulatory Visit | Attending: Family Medicine | Admitting: Family Medicine

## 2024-04-14 DIAGNOSIS — I898 Other specified noninfective disorders of lymphatic vessels and lymph nodes: Secondary | ICD-10-CM | POA: Diagnosis not present

## 2024-04-14 DIAGNOSIS — Z85850 Personal history of malignant neoplasm of thyroid: Secondary | ICD-10-CM | POA: Insufficient documentation

## 2024-04-14 DIAGNOSIS — Z85038 Personal history of other malignant neoplasm of large intestine: Secondary | ICD-10-CM | POA: Insufficient documentation

## 2024-04-14 DIAGNOSIS — R221 Localized swelling, mass and lump, neck: Secondary | ICD-10-CM | POA: Diagnosis not present

## 2024-04-14 DIAGNOSIS — R9389 Abnormal findings on diagnostic imaging of other specified body structures: Secondary | ICD-10-CM

## 2024-04-14 DIAGNOSIS — D360 Benign neoplasm of lymph nodes: Secondary | ICD-10-CM | POA: Diagnosis not present

## 2024-04-14 DIAGNOSIS — R590 Localized enlarged lymph nodes: Secondary | ICD-10-CM | POA: Diagnosis present

## 2024-04-14 MED ORDER — LIDOCAINE HCL 1 % IJ SOLN
INTRAMUSCULAR | Status: AC
  Filled 2024-04-14: qty 20

## 2024-04-14 NOTE — Procedures (Signed)
  Procedure:  US  FNA L neck nodule 25g x5 Preprocedure diagnosis: The encounter diagnosis was Abnormal ultrasound. Postprocedure diagnosis: same EBL:    minimal Complications:   none immediate  See full dictation in [employer].  Nicky Barrack MD Main # [phone] Pager  [phone] Mobile [phone]

## 2024-04-15 DIAGNOSIS — C185 Malignant neoplasm of splenic flexure: Secondary | ICD-10-CM | POA: Diagnosis not present

## 2024-04-16 LAB — CYTOLOGY - NON PAP

## 2024-04-28 ENCOUNTER — Other Ambulatory Visit: Payer: Self-pay | Admitting: Family Medicine

## 2024-04-28 DIAGNOSIS — E876 Hypokalemia: Secondary | ICD-10-CM

## 2024-06-07 ENCOUNTER — Ambulatory Visit (INDEPENDENT_AMBULATORY_CARE_PROVIDER_SITE_OTHER): Admitting: Family Medicine

## 2024-06-07 ENCOUNTER — Encounter: Payer: Self-pay | Admitting: Family Medicine

## 2024-06-07 ENCOUNTER — Ambulatory Visit: Payer: Self-pay

## 2024-06-07 VITALS — BP 114/66 | HR 79 | Temp 97.6°F | Resp 18 | Ht 64.0 in | Wt 136.2 lb

## 2024-06-07 DIAGNOSIS — C185 Malignant neoplasm of splenic flexure: Secondary | ICD-10-CM | POA: Diagnosis not present

## 2024-06-07 DIAGNOSIS — M545 Low back pain, unspecified: Secondary | ICD-10-CM

## 2024-06-07 DIAGNOSIS — I10 Essential (primary) hypertension: Secondary | ICD-10-CM

## 2024-06-07 DIAGNOSIS — G8929 Other chronic pain: Secondary | ICD-10-CM

## 2024-06-07 MED ORDER — MELOXICAM 7.5 MG PO TABS: 7.5000 mg | ORAL_TABLET | Freq: Every day | ORAL | 1 refills | Status: DC | PRN

## 2024-06-07 NOTE — Telephone Encounter (Signed)
 BP at time of call 142/78, HR 76 FYI Only or Action Required?: FYI only for provider.  Patient was last seen in primary care on 03/23/2024 by Colette Torrence GRADE, MD. Called Nurse Triage reporting Hypertension. Symptoms began today. Interventions attempted: Nothing. Symptoms are: stable.  Triage Disposition: See PCP Within 2 Weeks  Patient/caregiver understands and will follow disposition?: yes  Copied from CRM [id number]. Topic: Clinical - Red Word Triage >> Jun 07, 2024  2:19 PM Donna BRAVO wrote: Red Word that prompted transfer to Nurse Triage:  patient was instructed to call PCP due to hight BP,  Allean Bud NP medical assistant took a BP reading several times, Kristin Curcio NP instructed patient  to call Dr Colette office today.    BP 182/79  and 171/77 Reason for Disposition  [1] Systolic BP  >= 130 OR Diastolic >= 80 AND [2] taking BP medications  Answer Assessment - Initial Assessment Questions 1. BLOOD PRESSURE: What is the blood pressure? Did you take at least two measurements 5 minutes apart?     BP 182/79  and 171/77 by onc staff 2. ONSET: When did you take your blood pressure?     This morning at oncologist 3. HOW: How did you take your blood pressure? (e.g., automatic home BP monitor, visiting nurse)     auto 4. HISTORY: Do you have a history of high blood pressure?     yes 5. MEDICINES: Are you taking any medicines for blood pressure? Have you missed any doses recently?     Hydrochlorothiazide -states her systolic has only ever gotten to 149/152 6. OTHER SYMPTOMS: Do you have any symptoms? (e.g., blurred vision, chest pain, difficulty breathing, headache, weakness)     denies  Protocols used: Blood Pressure - High-A-AH

## 2024-06-07 NOTE — Progress Notes (Signed)
 Established Patient Office Visit  Subjective   Patient ID: Erin Gay, female    DOB: 06/23/1946  Age: 78 y.o. MRN: [id number]  Chief Complaint  Patient presents with   Blood Pressure Check    Patient is here for a blood pressure check. She states that she was at appointment earlier this morning and got a high reading. Patient also would like a prescription for  Meloxicam  7.5mg     HPI  Hypertension Pt was seen by her oncologist this morning. She is here today due to her BP being high in the 180s. She reports she felt fine and wasn't stressed. She says she has checked it at home since before coming here and it was 140s sbp. She repeated it and it was 118 SBP.   She has used Mobic  for her foot pain in the past. She uses this for her back pain. 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 She would like this refilled.    Review of Systems  Musculoskeletal:  Positive for back pain.  All other systems reviewed and are negative.     Objective:     BP 114/66   Pulse 79   Temp 97.6 F (36.4 C) (Oral)   Resp 18   Ht 5' 4 (1.626 m)   Wt 136 lb 3.2 oz (61.8 kg)   SpO2 97%   BMI 23.38 kg/m  BP Readings from Last 3 Encounters:  06/07/24 114/66  04/14/24 (!) 155/66  03/23/24 132/65      Physical Exam Vitals and nursing note reviewed.  Constitutional:      Appearance: Normal appearance. She is normal weight.  HENT:     Head: Normocephalic and atraumatic.     Right Ear: External ear normal.     Left Ear: External ear normal.     Nose: Nose normal.     Mouth/Throat:     Mouth: Mucous membranes are moist.     Pharynx: Oropharynx is clear.   Eyes:     Conjunctiva/sclera: Conjunctivae normal.     Pupils: Pupils are equal, round, and reactive to light.    Cardiovascular:     Rate and Rhythm: Normal rate.  Pulmonary:     Effort: Pulmonary effort is normal.  Abdominal:     General: Bowel sounds are normal.   Skin:    General: Skin is warm.      Capillary Refill: Capillary refill takes less than 2 seconds.   Neurological:     General: No focal deficit present.     Mental Status: She is alert and oriented to person, place, and time. Mental status is at baseline.   Psychiatric:        Mood and Affect: Mood normal.        Behavior: Behavior normal.        Thought Content: Thought content normal.        Judgment: Judgment normal.     No results found for any visits on 06/07/24.     The 10-year ASCVD risk score (Arnett DK, et al., 2019) is: 21.6%    Assessment & Plan:   Problem List Items Addressed This Visit       Cardiovascular and Mediastinum   Essential hypertension - Primary   Other Visit Diagnoses       Chronic midline low back pain without sciatica       Relevant Medications   meloxicam  (MOBIC ) 7.5 MG tablet  Pt with controlled BP today. To monitor until next appt with sooner if she notices high bp readings.  She uses Mobic  prn for back pain. Requests refills on this.  No follow-ups on file.    Torrence CINDERELLA Barrier, MD

## 2024-07-16 ENCOUNTER — Other Ambulatory Visit: Payer: Self-pay | Admitting: Family Medicine

## 2024-07-16 DIAGNOSIS — I10 Essential (primary) hypertension: Secondary | ICD-10-CM

## 2024-07-19 ENCOUNTER — Encounter: Payer: Self-pay | Admitting: Family Medicine

## 2024-07-19 ENCOUNTER — Ambulatory Visit (INDEPENDENT_AMBULATORY_CARE_PROVIDER_SITE_OTHER): Admitting: Family Medicine

## 2024-07-19 VITALS — BP 133/73 | HR 75 | Temp 97.8°F | Ht 63.5 in | Wt 135.6 lb

## 2024-07-19 DIAGNOSIS — R519 Headache, unspecified: Secondary | ICD-10-CM | POA: Diagnosis not present

## 2024-07-19 DIAGNOSIS — I10 Essential (primary) hypertension: Secondary | ICD-10-CM

## 2024-07-19 DIAGNOSIS — R0609 Other forms of dyspnea: Secondary | ICD-10-CM

## 2024-07-19 DIAGNOSIS — E039 Hypothyroidism, unspecified: Secondary | ICD-10-CM

## 2024-07-19 NOTE — Progress Notes (Signed)
 Established Patient Office Visit  Subjective   Patient ID: Erin Gay, female    DOB: 1946/04/04  Age: 77 y.o. MRN: [id number]  Chief Complaint  Patient presents with   chronic condition     6 month chronic condition  follow up     HPI  Pt is here for 6 month follow up.  Pt with hx of hypercalcemia. Seen oncologist and calcium levels. She went in June and it was 11.1 and repeated last month and was 10.6.  She has colonoscopy planned for December. Will call to schedule.   Pt has hx of HTN. Checking at home and averaging 114 SBP. Taking HCT 25mg  daily.  Pt reports intermittent DOE for years. She says it feels like something in her chest. Not worse with food or drinks. She does have hx of esophageal dilation in 2022. She does have coughing when she feels this. She also says talking a lot makes this worse. She has to have another colonoscopy in December and reports she would discuss with her GI provider.   She says in the last few weeks, she had nagging posterior headache in the back of her head which is unusual for her. She says her head is sore and feels like it's a bump. She says her headache is gone.   Review of Systems  Neurological:  Positive for headaches.  All other systems reviewed and are negative.     Objective:     BP 133/73   Pulse 75   Temp 97.8 F (36.6 C)   Ht 5' 3.5 (1.613 m)   Wt 135 lb 9 oz (61.5 kg)   SpO2 96%   BMI 23.64 kg/m    Physical Exam Vitals and nursing note reviewed.  Constitutional:      Appearance: Normal appearance. She is normal weight.  HENT:     Head: Normocephalic and atraumatic.     Right Ear: External ear normal.     Left Ear: External ear normal.     Nose: Nose normal.     Mouth/Throat:     Mouth: Mucous membranes are moist.     Pharynx: Oropharynx is clear.  Eyes:     Conjunctiva/sclera: Conjunctivae normal.     Pupils: Pupils are equal, round, and reactive to light.  Cardiovascular:     Rate and Rhythm:  Normal rate and regular rhythm.     Pulses: Normal pulses.     Heart sounds: Normal heart sounds.  Pulmonary:     Effort: Pulmonary effort is normal.     Breath sounds: Normal breath sounds.  Skin:    General: Skin is warm.     Capillary Refill: Capillary refill takes less than 2 seconds.  Neurological:     General: No focal deficit present.     Mental Status: She is alert and oriented to person, place, and time. Mental status is at baseline.  Psychiatric:        Mood and Affect: Mood normal.        Behavior: Behavior normal.        Thought Content: Thought content normal.        Judgment: Judgment normal.     No results found for any visits on 07/19/24.    The 10-year ASCVD risk score (Arnett DK, et al., 2019) is: 28.3%    Assessment & Plan:   Problem List Items Addressed This Visit   None Essential hypertension  Acquired hypothyroidism  Hypercalcemia -  TSH + free T4  Nonintractable headache, unspecified chronicity pattern, unspecified headache type   Pt with stable BP. Continue regimen Taking synthroid  50mcg daily. To recheck today Calcium levels coming down. Will monitor for now. Advised to stop MTV for now. Pt no longer has headache. Worried about a bump but doesn't feel anything on my exam today. Will monitor.    No follow-ups on file.    Torrence CINDERELLA Barrier, MD  Total time spent with patient today 30 minutes. This includes reviewing records, evaluating the patient and coordinating care. Face-to-face time >50%.

## 2024-07-20 ENCOUNTER — Ambulatory Visit: Payer: Self-pay | Admitting: Family Medicine

## 2024-07-20 LAB — TSH+FREE T4
Free T4: 1.33 ng/dL (ref 0.82–1.77)
TSH: 3.91 u[IU]/mL (ref 0.450–4.500)

## 2024-08-15 ENCOUNTER — Other Ambulatory Visit: Payer: Self-pay | Admitting: Family Medicine

## 2024-08-15 DIAGNOSIS — G8929 Other chronic pain: Secondary | ICD-10-CM

## 2024-08-18 ENCOUNTER — Ambulatory Visit: Admitting: Family Medicine

## 2024-08-20 DIAGNOSIS — K219 Gastro-esophageal reflux disease without esophagitis: Secondary | ICD-10-CM | POA: Diagnosis not present

## 2024-08-20 DIAGNOSIS — R1013 Epigastric pain: Secondary | ICD-10-CM | POA: Diagnosis not present

## 2024-08-20 DIAGNOSIS — R1319 Other dysphagia: Secondary | ICD-10-CM | POA: Diagnosis not present

## 2024-08-31 DIAGNOSIS — R1319 Other dysphagia: Secondary | ICD-10-CM | POA: Diagnosis not present

## 2024-08-31 DIAGNOSIS — K219 Gastro-esophageal reflux disease without esophagitis: Secondary | ICD-10-CM | POA: Diagnosis not present

## 2024-08-31 DIAGNOSIS — K449 Diaphragmatic hernia without obstruction or gangrene: Secondary | ICD-10-CM | POA: Diagnosis not present

## 2024-08-31 DIAGNOSIS — R1013 Epigastric pain: Secondary | ICD-10-CM | POA: Diagnosis not present

## 2024-08-31 DIAGNOSIS — R1310 Dysphagia, unspecified: Secondary | ICD-10-CM | POA: Diagnosis not present

## 2024-09-18 ENCOUNTER — Other Ambulatory Visit: Payer: Self-pay | Admitting: Family Medicine

## 2024-09-18 DIAGNOSIS — I10 Essential (primary) hypertension: Secondary | ICD-10-CM

## 2024-10-28 ENCOUNTER — Ambulatory Visit: Admitting: Family Medicine

## 2024-10-28 ENCOUNTER — Ambulatory Visit (INDEPENDENT_AMBULATORY_CARE_PROVIDER_SITE_OTHER): Admitting: Family Medicine

## 2024-10-28 ENCOUNTER — Encounter: Payer: Self-pay | Admitting: Family Medicine

## 2024-10-28 VITALS — BP 132/77 | HR 69 | Temp 98.0°F | Ht 63.5 in | Wt 136.0 lb

## 2024-10-28 DIAGNOSIS — Z0000 Encounter for general adult medical examination without abnormal findings: Secondary | ICD-10-CM | POA: Diagnosis not present

## 2024-10-28 DIAGNOSIS — Z23 Encounter for immunization: Secondary | ICD-10-CM | POA: Diagnosis not present

## 2024-10-28 NOTE — Progress Notes (Signed)
 Chief Complaint  Patient presents with   Medicare Wellness     Subjective:   Erin Gay is a 78 y.o. female who presents for a Medicare Annual Wellness Visit.  Allergies (verified) Patient has no known allergies.   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 per pt   TONSILLECTOMY  78 years old   Family History  Problem Relation Age of Onset   Cancer Mother        pt unsure, colon resection done   Heart disease Mother    Hyperlipidemia Mother    Colon polyps Mother    Heart disease Father    Heart disease Brother    Diabetes Brother        t2dm   Social History   Occupational History   Not on file  Tobacco Use   Smoking status: Never    Passive exposure: Never   Smokeless tobacco: Never  Vaping Use   Vaping status: Never Used  Substance and Sexual Activity   Alcohol use: No    Alcohol/week: 0.0 standard drinks of alcohol   Drug use: No   Sexual activity: Not Currently   Tobacco Counseling Counseling given: Not Answered  SDOH Screenings   Food Insecurity: No Food Insecurity (10/28/2024)  Housing: Low Risk  (10/28/2024)  Transportation Needs: No Transportation Needs (10/28/2024)  Utilities: Not At Risk (10/28/2024)  Alcohol Screen: Low Risk  (07/29/2019)  Depression (PHQ2-9): Low Risk  (10/28/2024)  Financial Resource Strain: Low Risk  (03/20/2024)  Physical Activity: Insufficiently Active (10/28/2024)  Social Connections: Moderately Integrated (10/28/2024)  Stress: No Stress Concern Present (10/28/2024)  Tobacco Use: Low Risk  (10/28/2024)  Health Literacy:  Adequate Health Literacy (10/28/2024)   See flowsheets for full screening details  Depression Screen PHQ 2 & 9 Depression Scale- Over the past 2 weeks, how often have you been bothered by any of the following problems? Little interest or pleasure in doing things: 0 Feeling down, depressed, or hopeless (PHQ Adolescent also includes...irritable): 0 PHQ-2 Total Score: 0     Goals Addressed             This Visit's Progress    Mobility and Independence Optimized       Evidence-based guidance:  Refer to physical therapy or occupational therapy for assessment and individualized program.  Provide therapy that may include functional task training, balance, active or passive exercise, spasticity management, assistive device training, cardiorespiratory fitness, Pilates and telerehabilitation services.  Identify barriers to participation in therapy or exercise such as pain with activity, anticipated or imagined pain, transportation, depression or fear.  Work with patient and family to develop self-management plan to remove barriers.  Refer to speech/language pathologist to assess and treat speech/language deficit and swallowing impairment.  Periodically review ability to perform activities of daily living and required amount of assistance needed for safety, optimal independence and self-care.  Encourage appropriate vocational or educational counseling for re-entering the community, workplace or school; consider a driving evaluation.  Assist patient to  advocate for necessary adaptations to the work or school environment.  Refer to employer for information about FMLA (Family and Medical Leave Act), Short or Long-Term Disability and options for adjustments to work schedule, assignment or hours.   Notes:        Visit info / Clinical Intake: Medicare Wellness Visit Type:: Subsequent Annual Wellness Visit Persons participating in visit:: patient Medicare Wellness Visit Mode:: In-person (required  for WTM) Information given by:: patient Interpreter Needed?: No Pre-visit prep was completed: no AWV questionnaire completed by patient prior to visit?: no Living arrangements:: (!) lives alone Patient's Overall Health Status Rating: very good Typical amount of pain: none Does pain affect daily life?: no Are you currently prescribed opioids?: no  Dietary Habits and Nutritional Risks How many meals a day?: 2 Eats fruit and vegetables daily?: yes Most meals are obtained by: preparing own meals In the last 2 weeks, have you had any of the following?: none Diabetic:: no  Functional Status Activities of Daily Living (to include ambulation/medication): Independent Ambulation: Independent Medication Administration: Independent Home Management: Independent Manage your own finances?: yes Primary transportation is: driving Concerns about vision?: no *vision screening is required for WTM* Concerns about hearing?: no  Fall Screening Falls in the past year?: 0 Number of falls in past year: 0 Was there an injury with Fall?: 0 Fall Risk Category Calculator: 0 Patient Fall Risk Level: Low Fall Risk  Fall Risk Patient at Risk for Falls Due to: No Fall Risks Fall risk Follow up: Falls evaluation completed  Home and Transportation Safety: All rugs have non-skid backing?: yes All stairs or steps have railings?: N/A, no stairs Grab bars in the bathtub or shower?: (!) no Have non-skid surface in bathtub or shower?: yes Good home lighting?: yes Regular seat belt use?: yes Hospital stays in the last year:: no  Cognitive Assessment Difficulty concentrating, remembering, or making decisions? : no Will 6CIT or Mini Cog be Completed: yes What year is it?: 0 points What month is it?: 0 points Give patient an address phrase to remember (5 components): [address] [place] About what time is it?: 0 points Count backwards from 20 to 1: 0 points Say the months of the year in  reverse: 0 points Repeat the address phrase from earlier: 0 points 6 CIT Score: 0 points  Advance Directives (For Healthcare) Does Patient Have a Medical Advance Directive?: Yes Does patient want to make changes to medical advance directive?: No - Patient declined Type of Advance Directive: Healthcare Power of [place]; Living will Copy of Healthcare Power of Attorney in Chart?: No - copy requested (does not want copy in her medical record.) Copy of Living Will in Chart?: No - copy requested Living Will Requested and Now in Chart: -- (declines copy in record)  Reviewed/Updated  Reviewed/Updated: Reviewed All (Medical, Surgical, Family, Medications, Allergies, Care Teams, Patient Goals); Medical History; Surgical History; Family History; Medications; Allergies; Care Teams; Patient Goals        Objective:    Today's Vitals   10/28/24 0934  BP: 132/77  Pulse: 69  Temp: 98 F (36.7 C)  TempSrc: Oral  SpO2: 99%  Weight: 136 lb (61.7 kg)  Height: 5' 3.5 (1.613 m)  PainSc: 0-No pain   Body mass index is 23.71 kg/m.  Current Medications (verified) Outpatient Encounter Medications as of 10/28/2024  Medication Sig   Cholecalciferol (VITAMIN D3) 50 MCG (2000 UT) CAPS Take by mouth.   hydrochlorothiazide  (HYDRODIURIL ) 25 MG tablet TAKE 1  TABLET BY MOUTH DAILY   levothyroxine  (SYNTHROID ) 50 MCG tablet TAKE 1 TABLET BY MOUTH DAILY   meloxicam  (MOBIC ) 7.5 MG tablet TAKE 1 TABLET BY MOUTH DAILY AS  NEEDED FOR PAIN   Multiple Vitamin (MULTIVITAMIN PO) Take by mouth.   pantoprazole (PROTONIX) 40 MG tablet Take 40 mg by mouth daily.   potassium chloride  (KLOR-CON ) 10 MEQ tablet TAKE 1 TABLET BY MOUTH DAILY   famotidine (PEPCID) 20 MG tablet Take 20 mg by mouth daily.   [DISCONTINUED] cholecalciferol (VITAMIN D3) 25 MCG (1000 UT) tablet Take 2,000 Units by mouth daily. (Patient not taking: Reported on 07/19/2024)   [DISCONTINUED] fluvastatin  (LESCOL ) 40 MG capsule Take 1 capsule (40 mg  total) by mouth every other day.   [DISCONTINUED] Multiple Vitamins-Minerals (OCUVITE-LUTEIN PO)    Facility-Administered Encounter Medications as of 10/28/2024  Medication   triamcinolone  acetonide (KENALOG ) 10 MG/ML injection 5 mg   Hearing/Vision screen Hearing Screening - Comments:: Grossly intact.  Vision Screening - Comments:: Wears reading glasses. No concerns  Immunizations and Health Maintenance Health Maintenance  Topic Date Due   DTaP/Tdap/Td (1 - Tdap) Never done   Zoster Vaccines- Shingrix (1 of 2) Never done   Influenza Vaccine  07/16/2024   COVID-19 Vaccine (5 - 2025-26 season) 08/16/2024   Colonoscopy  11/16/2024   Medicare Annual Wellness (AWV)  10/28/2025   Pneumococcal Vaccine: 50+ Years  Completed   DEXA SCAN  Completed   Hepatitis C Screening  Completed   Meningococcal B Vaccine  Aged Out   Mammogram  Discontinued        Assessment/Plan:  This is a routine wellness examination for [facility].  Patient Care Team: Colette Torrence GRADE, MD as PCP - General (Family Medicine)  I have personally reviewed and noted the following in the patient's chart:   Medical and social history Use of alcohol, tobacco or illicit drugs  Current medications and supplements including opioid prescriptions. Functional ability and status Nutritional status Physical activity Advanced directives List of other physicians: Dr Jackye: hand; Dr. Gorge: GYN; Dr. Charley: GI for colonoscopy due in December 2025 Hospitalizations, surgeries, and ER visits in previous 12 months Vitals Screenings to include cognitive, depression, and falls Referrals and appointments  No orders of the defined types were placed in this encounter.  In addition, I have reviewed and discussed with patient certain preventive protocols, quality metrics, and best practice recommendations. A written personalized care plan for preventive services as well as general preventive health recommendations were provided to  patient.   Darice JONELLE Brownie, FNP   10/28/2024   No follow-ups on file.  After Visit Summary: (In Person-Declined) Patient declined AVS at this time.  Nurse Notes: Influenza vaccine today
